# Patient Record
Sex: Male | Born: 1960 | Race: White | Hispanic: No | Marital: Single | Smoking: Light tobacco smoker
Health system: Southern US, Community
[De-identification: ages and names within clinical notes are randomized; demographics above are authoritative.]

## PROBLEM LIST (undated history)

## (undated) DIAGNOSIS — M1990 Unspecified osteoarthritis, unspecified site: Secondary | ICD-10-CM

## (undated) DIAGNOSIS — Z8489 Family history of other specified conditions: Secondary | ICD-10-CM

## (undated) DIAGNOSIS — Z87442 Personal history of urinary calculi: Secondary | ICD-10-CM

## (undated) HISTORY — PX: LITHOTRIPSY: SUR834

---

## 1999-08-26 ENCOUNTER — Emergency Department (HOSPITAL_COMMUNITY): Admission: EM | Admit: 1999-08-26 | Discharge: 1999-08-26 | Payer: Self-pay

## 2000-09-09 ENCOUNTER — Encounter: Payer: Self-pay | Admitting: Emergency Medicine

## 2000-09-09 ENCOUNTER — Emergency Department (HOSPITAL_COMMUNITY): Admission: EM | Admit: 2000-09-09 | Discharge: 2000-09-09 | Payer: Self-pay | Admitting: Emergency Medicine

## 2005-02-14 ENCOUNTER — Emergency Department (HOSPITAL_COMMUNITY): Admission: EM | Admit: 2005-02-14 | Discharge: 2005-02-14 | Payer: Self-pay | Admitting: Emergency Medicine

## 2005-03-24 ENCOUNTER — Emergency Department (HOSPITAL_COMMUNITY): Admission: EM | Admit: 2005-03-24 | Discharge: 2005-03-24 | Payer: Self-pay | Admitting: Emergency Medicine

## 2006-11-23 HISTORY — PX: HAND SURGERY: SHX662

## 2006-12-17 ENCOUNTER — Emergency Department (HOSPITAL_COMMUNITY): Admission: EM | Admit: 2006-12-17 | Discharge: 2006-12-18 | Payer: Self-pay | Admitting: Emergency Medicine

## 2012-05-20 ENCOUNTER — Encounter (HOSPITAL_COMMUNITY): Payer: Self-pay | Admitting: Emergency Medicine

## 2012-05-20 ENCOUNTER — Emergency Department (HOSPITAL_COMMUNITY)
Admission: EM | Admit: 2012-05-20 | Discharge: 2012-05-21 | Disposition: A | Discharge status: Home / Self Care | Payer: Self-pay | Attending: Emergency Medicine | Admitting: Emergency Medicine

## 2012-05-20 DIAGNOSIS — F17200 Nicotine dependence, unspecified, uncomplicated: Secondary | ICD-10-CM | POA: Insufficient documentation

## 2012-05-20 DIAGNOSIS — K529 Noninfective gastroenteritis and colitis, unspecified: Secondary | ICD-10-CM

## 2012-05-20 DIAGNOSIS — K5289 Other specified noninfective gastroenteritis and colitis: Secondary | ICD-10-CM | POA: Insufficient documentation

## 2012-05-20 LAB — POCT I-STAT, CHEM 8
BUN: 13 mg/dL (ref 6–23)
Calcium, Ion: 1.11 mmol/L — ABNORMAL LOW (ref 1.12–1.32)
Chloride: 101 mEq/L (ref 96–112)

## 2012-05-20 MED ORDER — ONDANSETRON HCL 4 MG/2ML IJ SOLN
4.0000 mg | Freq: Once | INTRAMUSCULAR | Status: AC
  Administered 2012-05-20: 4 mg via INTRAVENOUS
  Filled 2012-05-20: qty 2

## 2012-05-20 MED ORDER — SODIUM CHLORIDE 0.9 % IV BOLUS (SEPSIS)
1000.0000 mL | Freq: Once | INTRAVENOUS | Status: AC
  Administered 2012-05-20: 1000 mL via INTRAVENOUS

## 2012-05-20 NOTE — ED Notes (Signed)
PT. REPORTS MID ABDOMINAL PAIN WITH VOMITTING AND DIARRHEA FOR SEVERAL DAYS , STATES HISTORY OF KIDNEY STONES WITH SIMILAR SYMPTOMS. DENIES HEMATURIA OR DYSURIA.

## 2012-05-20 NOTE — ED Provider Notes (Addendum)
History     CSN: [id number]  Arrival date & time 05/20/12  1908   First MD Initiated Contact with Patient 05/20/12 2303      Chief Complaint  Patient presents with  . Abdominal Pain    (Consider location/radiation/quality/duration/timing/severity/associated sxs/prior treatment) The history is provided by the patient.   the patient is a 51 year old, male, with no significant past medical history, who complains of nausea, vomiting, diarrhea, and mild abdominal pain throughout the day.  He denies fevers.  He denies rash.  He denies blood in his stool or emesis.  He, says the emesis is green.  He has not been on antibiotics recently and he denies exposure to anyone with similar symptoms.  Presently, he has no nausea, and no abdominal pain.  He states that he gets lightheaded, when he stands, up.  Past Medical History  Diagnosis Date  . Kidney stones     Past Surgical History  Procedure Date  . Lithotripsy     No family history on file.  History  Substance Use Topics  . Smoking status: Current Everyday Smoker  . Smokeless tobacco: Not on file  . Alcohol Use: Yes      Review of Systems  Constitutional: Positive for chills. Negative for fever and diaphoresis.  Gastrointestinal: Positive for nausea, vomiting, abdominal pain and diarrhea. Negative for blood in stool.  Skin: Negative for rash.  Neurological: Positive for light-headedness.  Psychiatric/Behavioral: Negative for confusion.  All other systems reviewed and are negative.    Allergies  Review of patient's allergies indicates no known allergies.  Home Medications  No current outpatient prescriptions on file.  BP 117/67  Pulse 59  Temp 98.4 F (36.9 C) (Oral)  Resp 18  SpO2 95%  Physical Exam  Nursing note and vitals reviewed. Constitutional: He is oriented to person, place, and time. He appears well-developed and well-nourished. No distress.  HENT:  Head: Normocephalic and atraumatic.  Eyes:  Conjunctivae are normal.  Neck: Normal range of motion. Neck supple.  Cardiovascular: Normal rate.   No murmur heard. Pulmonary/Chest: Effort normal. No respiratory distress.  Abdominal: Soft. He exhibits no distension. There is no tenderness.  Musculoskeletal: Normal range of motion.  Neurological: He is alert and oriented to person, place, and time.  Skin: Skin is warm and dry.  Psychiatric: He has a normal mood and affect. Thought content normal.    ED Course  Procedures (including critical care time) 51 year old, male, with symptoms consistent with gastroenteritis.  He is not toxic.  There is no evidence of significant dehydration.  On examination.  Labs Reviewed - No data to display No results found.   No diagnosis found.  1:06 AM sxs resolved  MDM  Gastroenteritis without signs of toxicity        Cheri Guppy, MD 05/20/12 2325  Cheri Guppy, MD 05/21/12 4098

## 2012-05-21 MED ORDER — LOPERAMIDE HCL 2 MG PO CAPS: 2.0000 mg | ORAL_CAPSULE | Freq: Four times a day (QID) | ORAL | Status: AC | PRN

## 2012-05-21 MED ORDER — METOCLOPRAMIDE HCL 10 MG PO TABS: 10.0000 mg | ORAL_TABLET | Freq: Four times a day (QID) | ORAL | Status: DC

## 2012-05-21 NOTE — Discharge Instructions (Signed)
Your blood tests do not show any signs of significant dehydration from the vomiting, and diarrhea.  Use Imodium for diarrhea, and Reglan for nausea, and vomiting.  Followup with your Dr. if your symptoms.  Last more than 2-3 days

## 2014-05-27 ENCOUNTER — Emergency Department (HOSPITAL_COMMUNITY)
Admission: EM | Admit: 2014-05-27 | Discharge: 2014-05-27 | Disposition: A | Discharge status: Home / Self Care | Payer: Self-pay | Attending: Emergency Medicine | Admitting: Emergency Medicine

## 2014-05-27 ENCOUNTER — Encounter (HOSPITAL_COMMUNITY): Payer: Self-pay | Admitting: Emergency Medicine

## 2014-05-27 ENCOUNTER — Emergency Department (INDEPENDENT_AMBULATORY_CARE_PROVIDER_SITE_OTHER)
Admission: EM | Admit: 2014-05-27 | Discharge: 2014-05-27 | Disposition: A | Discharge status: Other Acute Inpatient Hospital | Payer: Self-pay | Source: Home / Self Care | Attending: Family Medicine | Admitting: Family Medicine

## 2014-05-27 DIAGNOSIS — Z23 Encounter for immunization: Secondary | ICD-10-CM | POA: Insufficient documentation

## 2014-05-27 DIAGNOSIS — W57XXXA Bitten or stung by nonvenomous insect and other nonvenomous arthropods, initial encounter: Secondary | ICD-10-CM

## 2014-05-27 DIAGNOSIS — Z79899 Other long term (current) drug therapy: Secondary | ICD-10-CM | POA: Insufficient documentation

## 2014-05-27 DIAGNOSIS — L03113 Cellulitis of right upper limb: Secondary | ICD-10-CM

## 2014-05-27 DIAGNOSIS — F17200 Nicotine dependence, unspecified, uncomplicated: Secondary | ICD-10-CM | POA: Insufficient documentation

## 2014-05-27 DIAGNOSIS — T148 Other injury of unspecified body region: Principal | ICD-10-CM

## 2014-05-27 DIAGNOSIS — IMO0002 Reserved for concepts with insufficient information to code with codable children: Secondary | ICD-10-CM | POA: Insufficient documentation

## 2014-05-27 DIAGNOSIS — R110 Nausea: Secondary | ICD-10-CM | POA: Insufficient documentation

## 2014-05-27 DIAGNOSIS — Y92009 Unspecified place in unspecified non-institutional (private) residence as the place of occurrence of the external cause: Secondary | ICD-10-CM | POA: Insufficient documentation

## 2014-05-27 DIAGNOSIS — L089 Local infection of the skin and subcutaneous tissue, unspecified: Secondary | ICD-10-CM | POA: Insufficient documentation

## 2014-05-27 DIAGNOSIS — Z87442 Personal history of urinary calculi: Secondary | ICD-10-CM | POA: Insufficient documentation

## 2014-05-27 DIAGNOSIS — Y9389 Activity, other specified: Secondary | ICD-10-CM | POA: Insufficient documentation

## 2014-05-27 LAB — CBC WITH DIFFERENTIAL/PLATELET
Basophils Absolute: 0 10*3/uL (ref 0.0–0.1)
Basophils Relative: 0 % (ref 0–1)
EOS ABS: 0.2 10*3/uL (ref 0.0–0.7)
EOS PCT: 2 % (ref 0–5)
HEMATOCRIT: 50.9 % (ref 39.0–52.0)
Hemoglobin: 17.4 g/dL — ABNORMAL HIGH (ref 13.0–17.0)
LYMPHS ABS: 1.8 10*3/uL (ref 0.7–4.0)
LYMPHS PCT: 12 % (ref 12–46)
MCH: 33.5 pg (ref 26.0–34.0)
MCHC: 34.2 g/dL (ref 30.0–36.0)
MCV: 97.9 fL (ref 78.0–100.0)
MONO ABS: 1.2 10*3/uL — AB (ref 0.1–1.0)
MONOS PCT: 8 % (ref 3–12)
Neutro Abs: 12 10*3/uL — ABNORMAL HIGH (ref 1.7–7.7)
Neutrophils Relative %: 78 % — ABNORMAL HIGH (ref 43–77)
PLATELETS: 173 10*3/uL (ref 150–400)
RBC: 5.2 MIL/uL (ref 4.22–5.81)
RDW: 12.7 % (ref 11.5–15.5)
WBC: 15.2 10*3/uL — AB (ref 4.0–10.5)

## 2014-05-27 LAB — BASIC METABOLIC PANEL
Anion gap: 12 (ref 5–15)
BUN: 8 mg/dL (ref 6–23)
CALCIUM: 9.6 mg/dL (ref 8.4–10.5)
CO2: 28 meq/L (ref 19–32)
CREATININE: 0.71 mg/dL (ref 0.50–1.35)
Chloride: 99 mEq/L (ref 96–112)
GFR calc Af Amer: >90 mL/min (ref >90)
GLUCOSE: 108 mg/dL — AB (ref 70–99)
Potassium: 4.4 mEq/L (ref 3.7–5.3)
Sodium: 139 mEq/L (ref 137–147)

## 2014-05-27 MED ORDER — TETANUS-DIPHTH-ACELL PERTUSSIS 5-2.5-18.5 LF-MCG/0.5 IM SUSP
INTRAMUSCULAR | Status: AC
  Filled 2014-05-27: qty 0.5

## 2014-05-27 MED ORDER — CLINDAMYCIN HCL 150 MG PO CAPS: 150.0000 mg | ORAL_CAPSULE | Freq: Four times a day (QID) | ORAL | Status: DC

## 2014-05-27 MED ORDER — TETANUS-DIPHTH-ACELL PERTUSSIS 5-2.5-18.5 LF-MCG/0.5 IM SUSP
0.5000 mL | Freq: Once | INTRAMUSCULAR | Status: AC
  Administered 2014-05-27: 0.5 mL via INTRAMUSCULAR

## 2014-05-27 MED ORDER — ONDANSETRON 4 MG PO TBDP
4.0000 mg | ORAL_TABLET | Freq: Once | ORAL | Status: AC
  Administered 2014-05-27: 4 mg via ORAL
  Filled 2014-05-27: qty 1

## 2014-05-27 MED ORDER — OXYCODONE-ACETAMINOPHEN 5-325 MG PO TABS
2.0000 | ORAL_TABLET | Freq: Once | ORAL | Status: AC
  Administered 2014-05-27: 2 via ORAL
  Filled 2014-05-27: qty 2

## 2014-05-27 MED ORDER — CLINDAMYCIN PHOSPHATE 600 MG/50ML IV SOLN
600.0000 mg | Freq: Once | INTRAVENOUS | Status: AC
  Administered 2014-05-27: 600 mg via INTRAVENOUS
  Filled 2014-05-27: qty 50

## 2014-05-27 MED ORDER — OXYCODONE-ACETAMINOPHEN 5-325 MG PO TABS: 2.0000 | ORAL_TABLET | ORAL | Status: DC | PRN

## 2014-05-27 NOTE — Discharge Instructions (Signed)
Please keep forearm elevated above your heart as often as possible.  Take antibiotic as prescribed for the full duration, take pain medication as needed.  Follow up with your doctor in 2 days for recheck of your wound.  Return if your symptoms worsen or if you have other concerns.    Cellulitis Cellulitis is an infection of the skin and the tissue beneath it. The infected area is usually red and tender. Cellulitis occurs most often in the arms and lower legs.  CAUSES  Cellulitis is caused by bacteria that enter the skin through cracks or cuts in the skin. The most common types of bacteria that cause cellulitis are Staphylococcus and Streptococcus. SYMPTOMS   Redness and warmth.  Swelling.  Tenderness or pain.  Fever. DIAGNOSIS  Your caregiver can usually determine what is wrong based on a physical exam. Blood tests may also be done. TREATMENT  Treatment usually involves taking an antibiotic medicine. HOME CARE INSTRUCTIONS   Take your antibiotics as directed. Finish them even if you start to feel better.  Keep the infected arm or leg elevated to reduce swelling.  Apply a warm cloth to the affected area up to 4 times per day to relieve pain.  Only take over-the-counter or prescription medicines for pain, discomfort, or fever as directed by your caregiver.  Keep all follow-up appointments as directed by your caregiver. SEEK MEDICAL CARE IF:   You notice red streaks coming from the infected area.  Your red area gets larger or turns dark in color.  Your bone or joint underneath the infected area becomes painful after the skin has healed.  Your infection returns in the same area or another area.  You notice a swollen bump in the infected area.  You develop new symptoms. SEEK IMMEDIATE MEDICAL CARE IF:   You have a fever.  You feel very sleepy.  You develop vomiting or diarrhea.  You have a general ill feeling (malaise) with muscle aches and pains. MAKE SURE YOU:    Understand these instructions.  Will watch your condition.  Will get help right away if you are not doing well or get worse. Document Released: 08/19/2005 Document Revised: 05/10/2012 Document Reviewed: 01/25/2012 [facility] Patient Information 2015 [place], [place]. This information is not intended to replace advice given to you by your health care provider. Make sure you discuss any questions you have with your health care provider.

## 2014-05-27 NOTE — ED Provider Notes (Signed)
Medical screening examination/treatment/procedure(s) were performed by non-physician practitioner and as supervising physician I was immediately available for consultation/collaboration.   EKG Interpretation None        Maudry Diego, MD 05/27/14 [phone]

## 2014-05-27 NOTE — ED Notes (Signed)
Marked and dated right arm swelling: from elbow to top of hand.

## 2014-05-27 NOTE — ED Notes (Signed)
Pt  Has  2  Lesions  On r  Forearm  He noticed  Yesterday  The  Arm is  Swollen  And  Tender  To  The touch   It  Is  Draining  Somewhat     Warm  To  The  Touch                 Pt  States     He  Was  Cleaning  Swimming  Pool  Yesterday     When     He felt  What  May  Have      Been  Bitten  By  something

## 2014-05-27 NOTE — ED Provider Notes (Signed)
CSN: [id number]     Arrival date & time 05/27/14  1012 History   This chart was scribed for non-physician practitioner  Hyman Bible, PA-C, working with Blanchie Dessert, MD, by Neta Ehlers, ED Scribe. This patient was seen in room TR09C/TR09C and the patient's care was started at 11:16 AM.  First MD Initiated Contact with Patient 05/27/14 1102     Chief Complaint  Patient presents with  . Insect Bite    The history is provided by the patient. No language interpreter was used.   HPI Comments: Nathan [name] is a 53 y.o. male who presents to the Emergency Department complaining of a suspected insect bite to his right forearm which occurred yesterday and which has increased in swelling, redness, and pain. He sought treatment at an urgent care today and was referred to the ED for IV antibiotics. The pt reports pus draining from the affected site. He also endorses nausea. He denies a fever; in the ED his temperature is 98.1 F. He also denies h/o DM or HIV. The pt is a current smoker.   Past Medical History  Diagnosis Date  . Kidney stones    Past Surgical History  Procedure Laterality Date  . Lithotripsy     No family history on file. History  Substance Use Topics  . Smoking status: Current Every Day Smoker  . Smokeless tobacco: Not on file  . Alcohol Use: Yes    Review of Systems  Constitutional: Negative for fever.  Gastrointestinal: Positive for nausea. Negative for vomiting.  Skin: Positive for color change and wound.  All other systems reviewed and are negative.   Allergies  Review of patient's allergies indicates no known allergies.  Home Medications   Prior to Admission medications   Medication Sig Start Date End Date Taking? Authorizing Provider  metoCLOPramide (REGLAN) 10 MG tablet Take 1 tablet (10 mg total) by mouth every 6 (six) hours. 05/21/12 05/31/12  Barbara Cower, MD   Triage Vitals: BP 138/75  Pulse 62  Temp(Src) 98.1 F (36.7 C) (Oral)  Resp 22   SpO2 98%  Physical Exam  Nursing note and vitals reviewed. Constitutional: He is oriented to person, place, and time. He appears well-developed and well-nourished. No distress.  HENT:  Head: Normocephalic and atraumatic.  Eyes: Conjunctivae and EOM are normal.  Neck: Neck supple. No tracheal deviation present.  Cardiovascular: Normal rate, regular rhythm and normal heart sounds.   No murmur heard. 2+ radial pulse.   Pulmonary/Chest: Effort normal and breath sounds normal. No respiratory distress. He has no wheezes. He has no rales.  Musculoskeletal: Normal range of motion.  Full ROM of right elbow and right wrist.   Neurological: He is alert and oriented to person, place, and time.  Distal sensation of all fingers of right hand is intact.   Skin: Skin is warm and dry. There is erythema.  1 cm abscess of the dorsal ulnar aspect of the right forearm. Another 1 cm abscess of the same area.  Surrounding the abscesses are erythema and swelling. No streaking.     Psychiatric: He has a normal mood and affect. His behavior is normal.    ED Course  Procedures (including critical care time)  DIAGNOSTIC STUDIES: Oxygen Saturation is 98% on room air, normal by my interpretation.    COORDINATION OF CARE:  11:22 AM- Discussed treatment plan with patient, and the patient agreed to the plan. The plan includes IV antibiotics, pain medication, and possible incision and drainage.  Labs Review Labs Reviewed - No data to display  Imaging Review No results found.   EKG Interpretation None      MDM   Final diagnoses:  None   Patient presenting with Cellulitis of his forearm.  He is currently afebrile.  He has not been on any antibiotics.  Seen at [facility] prior to arrival and sent to the ED for IV antibiotics.  No erythematous streaking.  Patient given IV Clindamycin and will be discharged home with antibiotics.  Patient not immunocompromised.  Patient signed out to Domenic Moras, PA-C.  I  personally performed the services described in this documentation, which was scribed in my presence. The recorded information has been reviewed and is accurate.    Hyman Bible, PA-C 06/06/14 2326

## 2014-05-27 NOTE — ED Notes (Signed)
Pt is calling someone now to make arrangements to be picked up from ED upon discharge.

## 2014-05-27 NOTE — ED Provider Notes (Signed)
Cellulitis of R forearm from insect bite.  Currently receiving IV abx, clindamycin.  Pt without systemic involvement.  Once finished with IV abx, pt can be discharge with PO clinda if sxs controlled.    BP 121/79  Pulse 59  Temp(Src) 97.9 F (36.6 C) (Oral)  Resp 17  SpO2 98%  I have reviewed nursing notes and vital signs. I personally reviewed the imaging tests through PACS system  I reviewed available ER/hospitalization records thought the EMR  Results for orders placed during the hospital encounter of 05/27/14  CBC WITH DIFFERENTIAL      Result Value Ref Range   WBC 15.2 (*) 4.0 - 10.5 K/uL   RBC 5.20  4.22 - 5.81 MIL/uL   Hemoglobin 17.4 (*) 13.0 - 17.0 g/dL   HCT 50.9  39.0 - 52.0 %   MCV 97.9  78.0 - 100.0 fL   MCH 33.5  26.0 - 34.0 pg   MCHC 34.2  30.0 - 36.0 g/dL   RDW 12.7  11.5 - 15.5 %   Platelets 173  150 - 400 K/uL   Neutrophils Relative % 78 (*) 43 - 77 %   Neutro Abs 12.0 (*) 1.7 - 7.7 K/uL   Lymphocytes Relative 12  12 - 46 %   Lymphs Abs 1.8  0.7 - 4.0 K/uL   Monocytes Relative 8  3 - 12 %   Monocytes Absolute 1.2 (*) 0.1 - 1.0 K/uL   Eosinophils Relative 2  0 - 5 %   Eosinophils Absolute 0.2  0.0 - 0.7 K/uL   Basophils Relative 0  0 - 1 %   Basophils Absolute 0.0  0.0 - 0.1 K/uL  BASIC METABOLIC PANEL      Result Value Ref Range   Sodium 139  137 - 147 mEq/L   Potassium 4.4  3.7 - 5.3 mEq/L   Chloride 99  96 - 112 mEq/L   CO2 28  19 - 32 mEq/L   Glucose, Bld 108 (*) 70 - 99 mg/dL   BUN 8  6 - 23 mg/dL   Creatinine, Ser 0.71  0.50 - 1.35 mg/dL   Calcium 9.6  8.4 - 10.5 mg/dL   GFR calc non Af Amer >90  >90 mL/min   GFR calc Af Amer >90  >90 mL/min   Anion gap 12  5 - 15   No results found.    Domenic Moras, PA-C 05/27/14 1424

## 2014-05-27 NOTE — ED Notes (Signed)
Pt. Stated, I [facility] to the [facility] and they sent me here for IV antibiotics.  I got bit yesterday while cleaning out my little girls pool

## 2014-05-27 NOTE — ED Provider Notes (Signed)
CSN: [id number]     Arrival date & time 05/27/14  0908 History   First MD Initiated Contact with Patient 05/27/14 0915     Chief Complaint  Patient presents with  . Extremity Pain   (Consider location/radiation/quality/duration/timing/severity/associated sxs/prior Treatment) Patient is a 53 y.o. male presenting with extremity pain. The history is provided by the patient.  Extremity Pain This is a new problem. The current episode started 12 to 24 hours ago (cleaning pool and noticed 2 bite sites to arm yest, rapidly progressive pain and sts to elbow during day. continues to swell, and pain). The problem has been rapidly worsening.    Past Medical History  Diagnosis Date  . Kidney stones    Past Surgical History  Procedure Laterality Date  . Lithotripsy     History reviewed. No pertinent family history. History  Substance Use Topics  . Smoking status: Current Every Day Smoker  . Smokeless tobacco: Not on file  . Alcohol Use: Yes    Review of Systems  Constitutional: Negative for fever and chills.  Skin: Positive for rash and wound.    Allergies  Review of patient's allergies indicates no known allergies.  Home Medications   Prior to Admission medications   Medication Sig Start Date End Date Taking? Authorizing Provider  metoCLOPramide (REGLAN) 10 MG tablet Take 1 tablet (10 mg total) by mouth every 6 (six) hours. 05/21/12 05/31/12  Barbara Cower, MD   BP 135/85  Pulse 60  Temp(Src) 98.9 F (37.2 C) (Oral)  Resp 16  SpO2 97% Physical Exam  Nursing note and vitals reviewed. Constitutional: He is oriented to person, place, and time. He appears well-developed and well-nourished. He appears distressed.  Neurological: He is alert and oriented to person, place, and time.  Skin: Skin is warm and dry. Rash noted. There is erythema. No pallor.       ED Course  Procedures (including critical care time) Labs Review Labs Reviewed  WOUND CULTURE    Imaging Review No  results found.   MDM   1. Cellulitis of right upper extremity   sent for iv abx for rapidly progressive cellulitis right forarm.    Billy Fischer, MD 05/27/14 [phone]

## 2014-05-29 LAB — WOUND CULTURE
Culture: NO GROWTH
Special Requests: NORMAL

## 2014-06-08 NOTE — ED Provider Notes (Signed)
Medical screening examination/treatment/procedure(s) were performed by non-physician practitioner and as supervising physician I was immediately available for consultation/collaboration.   EKG Interpretation None        Blanchie Dessert, MD 06/08/14 1332

## 2017-08-17 ENCOUNTER — Ambulatory Visit: Payer: Self-pay | Admitting: Orthopedic Surgery

## 2017-08-18 ENCOUNTER — Encounter (HOSPITAL_COMMUNITY)
Admission: RE | Admit: 2017-08-18 | Discharge: 2017-08-18 | Disposition: A | Discharge status: Home / Self Care | Payer: No Typology Code available for payment source | Source: Ambulatory Visit | Attending: Orthopedic Surgery | Admitting: Orthopedic Surgery

## 2017-08-18 ENCOUNTER — Encounter (HOSPITAL_COMMUNITY): Payer: Self-pay

## 2017-08-18 DIAGNOSIS — Z01812 Encounter for preprocedural laboratory examination: Secondary | ICD-10-CM | POA: Diagnosis present

## 2017-08-18 DIAGNOSIS — M1611 Unilateral primary osteoarthritis, right hip: Secondary | ICD-10-CM | POA: Diagnosis not present

## 2017-08-18 HISTORY — DX: Personal history of urinary calculi: Z87.442

## 2017-08-18 HISTORY — DX: Unspecified osteoarthritis, unspecified site: M19.90

## 2017-08-18 LAB — BASIC METABOLIC PANEL
Anion gap: 8 (ref 5–15)
BUN: 7 mg/dL (ref 6–20)
CALCIUM: 9.7 mg/dL (ref 8.9–10.3)
CO2: 26 mmol/L (ref 22–32)
CREATININE: 0.72 mg/dL (ref 0.61–1.24)
Chloride: 101 mmol/L (ref 101–111)
GFR calc non Af Amer: >60 mL/min (ref >60)
Glucose, Bld: 95 mg/dL (ref 65–99)
Potassium: 3.6 mmol/L (ref 3.5–5.1)
SODIUM: 135 mmol/L (ref 135–145)

## 2017-08-18 LAB — CBC
HCT: 47.1 % (ref 39.0–52.0)
Hemoglobin: 16.2 g/dL (ref 13.0–17.0)
MCH: 32.3 pg (ref 26.0–34.0)
MCHC: 34.4 g/dL (ref 30.0–36.0)
MCV: 93.8 fL (ref 78.0–100.0)
PLATELETS: 193 10*3/uL (ref 150–400)
RBC: 5.02 MIL/uL (ref 4.22–5.81)
RDW: 12.2 % (ref 11.5–15.5)
WBC: 11.3 10*3/uL — AB (ref 4.0–10.5)

## 2017-08-18 LAB — SURGICAL PCR SCREEN
MRSA, PCR: NEGATIVE
Staphylococcus aureus: NEGATIVE

## 2017-08-18 LAB — ABO/RH: ABO/RH(D): O POS

## 2017-08-18 LAB — TYPE AND SCREEN
ABO/RH(D): O POS
Antibody Screen: NEGATIVE

## 2017-08-18 NOTE — Progress Notes (Signed)
Pt. rec's PCP from [facility].  Pt. Denies problems related to chest regarding lungs or heart.  Takes no meds., including no pain medicine. Pt. 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

## 2017-08-18 NOTE — Pre-Procedure Instructions (Signed)
Nathan [name]  08/18/2017      [facility] - [place], [place] - [place] [address] Phone: [phone] Fax: [phone]    Your procedure is scheduled on 08/30/2017.  Report to [facility] Admitting at 10:30 A.M.  Call this number if you have problems the morning of surgery:  [phone]   Remember:  Do not eat food or drink liquids after midnight. On SUNDAY  Take these medicines the morning of surgery with A SIP OF WATER : nothing    Do not wear jewelry   Do not wear lotions, powders, or perfumes, or deoderant.    Men may shave face and neck.   Do not bring valuables to the hospital.  [facility] is not responsible for any belongings or valuables.  Contacts, dentures or bridgework may not be worn into surgery.  Leave your suitcase in the car.  After surgery it may be brought to your room.  For patients admitted to the hospital, discharge time will be determined by your treatment team.  Patients discharged the day of surgery will not be allowed to drive home.   Name and phone number of your driver:   Family  Special instructions:  Special Instructions: [facility] - Preparing for Surgery  Before surgery, you can play an important role.  Because skin is not sterile, your skin needs to be as free of germs as possible.  You can reduce the number of germs on you skin by washing with CHG (chlorahexidine gluconate) soap before surgery.  CHG is an antiseptic cleaner which kills germs and bonds with the skin to continue killing germs even after washing.  Please DO NOT use if you have an allergy to CHG or antibacterial soaps.  If your skin becomes reddened/irritated stop using the CHG and inform your nurse when you arrive at Short Stay.  Do not shave (including legs and underarms) for at least 48 hours prior to the first CHG shower.  You may shave your face.  Please follow these instructions  carefully:   1.  Shower with CHG Soap the night before surgery and the  morning of Surgery.  2.  If you choose to wash your hair, wash your hair first as usual with your  normal shampoo.  3.  After you shampoo, rinse your hair and body thoroughly to remove the  Shampoo.  4.  Use CHG as you would any other liquid soap.  You can apply chg directly to the skin and wash gently with scrungie or a clean washcloth.  5.  Apply the CHG Soap to your body ONLY FROM THE NECK DOWN.    Do not use on open wounds or open sores.  Avoid contact with your eyes, ears, mouth and genitals (private parts).  Wash genitals (private parts)   with your normal soap.  6.  Wash thoroughly, paying special attention to the area where your surgery will be performed.  7.  Thoroughly rinse your body with warm water from the neck down.  8.  DO NOT shower/wash with your normal soap after using and rinsing off   the CHG Soap.  9.  Pat yourself dry with a clean towel.            10.  Wear clean pajamas.            11 .  Place clean sheets on your bed the night of your first  shower and do not sleep with pets.  Day of Surgery  Do not apply any lotions/deodorants the morning of surgery.  Please wear clean clothes to the hospital/surgery center.  Please read over the following fact sheets that you were given. Pain Booklet, Coughing and Deep Breathing, Total Joint Packet, MRSA Information and Surgical Site Infection Prevention

## 2017-08-27 ENCOUNTER — Ambulatory Visit: Payer: Self-pay | Admitting: Orthopedic Surgery

## 2017-08-27 MED ORDER — TRANEXAMIC ACID 1000 MG/10ML IV SOLN
1000.0000 mg | INTRAVENOUS | Status: AC
  Administered 2017-08-30: 1000 mg via INTRAVENOUS
  Filled 2017-08-27: qty 10

## 2017-08-27 MED ORDER — SODIUM CHLORIDE 0.9 % IV SOLN: INTRAVENOUS | Status: DC

## 2017-08-27 MED ORDER — CEFAZOLIN SODIUM-DEXTROSE 2-4 GM/100ML-% IV SOLN
2.0000 g | INTRAVENOUS | Status: AC
  Administered 2017-08-30: 2 g via INTRAVENOUS
  Filled 2017-08-27: qty 100

## 2017-08-27 MED ORDER — ACETAMINOPHEN 10 MG/ML IV SOLN
1000.0000 mg | INTRAVENOUS | Status: AC
  Administered 2017-08-30: 1000 mg via INTRAVENOUS
  Filled 2017-08-27 (×2): qty 100

## 2017-08-27 NOTE — H&P (Signed)
TOTAL HIP ADMISSION H&P  Patient is admitted for right total hip arthroplasty.  Subjective:  Chief Complaint: right hip pain  HPI: Nathan [name], 56 y.o. male, has a history of pain and functional disability in the right hip(s) due to arthritis and patient has failed non-surgical conservative treatments for greater than 12 weeks to include NSAID's and/or analgesics, flexibility and strengthening excercises, use of assistive devices, weight reduction as appropriate and activity modification.  Onset of symptoms was gradual starting 5 years ago with gradually worsening course since that time.The patient noted no past surgery on the right hip(s).  Patient currently rates pain in the right hip at 10 out of 10 with activity. Patient has night pain, worsening of pain with activity and weight bearing, trendelenberg gait, pain that interfers with activities of daily living, pain with passive range of motion and crepitus. Patient has evidence of subchondral cysts, subchondral sclerosis, periarticular osteophytes and joint space narrowing by imaging studies. This condition presents safety issues increasing the risk of falls.  There is no current active infection.  There are no active problems to display for this patient.  Past Medical History:  Diagnosis Date  . Arthritis    dengenerative joint surgery-  R hip & L hip  . History of kidney stones    laser surgery  . Kidney stones     Past Surgical History:  Procedure Laterality Date  . HAND SURGERY Right 2008   thumb with pin  . LITHOTRIPSY       (Not in a hospital admission) No Known Allergies  Social History  Substance Use Topics  . Smoking status: Former Smoker    Quit date: 07/28/2017  . Smokeless tobacco: Never Used  . Alcohol use 3.6 oz/week    6 Cans of beer per week     Comment: one six pack per day     No family history on file.   Review of Systems  Constitutional: Negative.   HENT: Negative.   Eyes: Negative.   Respiratory:  Negative.   Cardiovascular: Negative.   Gastrointestinal: Negative.   Genitourinary: Negative.   Musculoskeletal: Positive for joint pain.  Skin: Negative.   Neurological: Negative.   Endo/Heme/Allergies: Negative.   Psychiatric/Behavioral: Negative.     Objective:  Physical Exam  Vitals reviewed. Constitutional: He is oriented to person, place, and time. He appears well-developed and well-nourished.  HENT:  Head: Normocephalic.  Eyes: Pupils are equal, round, and reactive to light. Conjunctivae are normal.  Neck: Normal range of motion. Neck supple.  Cardiovascular: Normal rate, regular rhythm and intact distal pulses.   Respiratory: He is in respiratory distress.  GI: Soft. He exhibits no distension.  Genitourinary:  Genitourinary Comments: deferred  Musculoskeletal:       Right hip: He exhibits decreased range of motion, decreased strength and bony tenderness.  Neurological: He is alert and oriented to person, place, and time. He has normal reflexes.  Skin: Skin is warm and dry.  Psychiatric: He has a normal mood and affect. His behavior is normal. Judgment and thought content normal.    Vital signs in last 24 hours: @VSRANGES @  Labs:   Estimated body mass index is 21.95 kg/m as calculated from the following:   Height as of 08/18/17: 5\' 10"  (1.778 m).   Weight as of 08/18/17: 69.4 kg (153 lb).   Imaging Review Plain radiographs demonstrate severe degenerative joint disease of the right hip(s). The bone quality appears to be adequate for age and reported activity  level.  Assessment/Plan:  End stage arthritis, right hip(s)  The patient history, physical examination, clinical judgement of the provider and imaging studies are consistent with end stage degenerative joint disease of the right hip(s) and total hip arthroplasty is deemed medically necessary. The treatment options including medical management, injection therapy, arthroscopy and arthroplasty were discussed  at length. The risks and benefits of total hip arthroplasty were presented and reviewed. The risks due to aseptic loosening, infection, stiffness, dislocation/subluxation,  thromboembolic complications and other imponderables were discussed.  The patient acknowledged the explanation, agreed to proceed with the plan and consent was signed. Patient is being admitted for inpatient treatment for surgery, pain control, PT, OT, prophylactic antibiotics, VTE prophylaxis, progressive ambulation and ADL's and discharge planning.The patient is planning to be discharged home with home health services

## 2017-08-30 ENCOUNTER — Inpatient Hospital Stay (HOSPITAL_COMMUNITY): Payer: Non-veteran care | Admitting: Anesthesiology

## 2017-08-30 ENCOUNTER — Inpatient Hospital Stay (HOSPITAL_COMMUNITY): Payer: Non-veteran care

## 2017-08-30 ENCOUNTER — Inpatient Hospital Stay (HOSPITAL_COMMUNITY)
Admission: RE | Admit: 2017-08-30 | Discharge: 2017-08-31 | DRG: 470 | Disposition: A | Discharge status: Home / Self Care | Payer: Non-veteran care | Source: Ambulatory Visit | Attending: Orthopedic Surgery | Admitting: Orthopedic Surgery

## 2017-08-30 ENCOUNTER — Encounter (HOSPITAL_COMMUNITY): Payer: Self-pay | Admitting: *Deleted

## 2017-08-30 ENCOUNTER — Encounter (HOSPITAL_COMMUNITY)
Admission: RE | Disposition: A | Discharge status: Home / Self Care | Payer: Self-pay | Source: Ambulatory Visit | Attending: Orthopedic Surgery

## 2017-08-30 DIAGNOSIS — R001 Bradycardia, unspecified: Secondary | ICD-10-CM

## 2017-08-30 DIAGNOSIS — M25751 Osteophyte, right hip: Secondary | ICD-10-CM | POA: Diagnosis present

## 2017-08-30 DIAGNOSIS — Z09 Encounter for follow-up examination after completed treatment for conditions other than malignant neoplasm: Secondary | ICD-10-CM

## 2017-08-30 DIAGNOSIS — M1611 Unilateral primary osteoarthritis, right hip: Principal | ICD-10-CM | POA: Diagnosis present

## 2017-08-30 DIAGNOSIS — Z87442 Personal history of urinary calculi: Secondary | ICD-10-CM | POA: Diagnosis not present

## 2017-08-30 DIAGNOSIS — Z87891 Personal history of nicotine dependence: Secondary | ICD-10-CM | POA: Diagnosis not present

## 2017-08-30 DIAGNOSIS — Z419 Encounter for procedure for purposes other than remedying health state, unspecified: Secondary | ICD-10-CM

## 2017-08-30 HISTORY — PX: TOTAL HIP ARTHROPLASTY: SHX124

## 2017-08-30 LAB — GLUCOSE, CAPILLARY
GLUCOSE-CAPILLARY: 92 mg/dL (ref 65–99)
Glucose-Capillary: 97 mg/dL (ref 65–99)

## 2017-08-30 SURGERY — ARTHROPLASTY, HIP, TOTAL, ANTERIOR APPROACH
Anesthesia: Spinal | Laterality: Right

## 2017-08-30 MED ORDER — PROPOFOL 10 MG/ML IV BOLUS
INTRAVENOUS | Status: AC
Start: 2017-08-30 — End: ?
  Filled 2017-08-30: qty 20

## 2017-08-30 MED ORDER — SODIUM CHLORIDE 0.9 % IR SOLN
Status: DC | PRN
  Administered 2017-08-30: 1000 mL

## 2017-08-30 MED ORDER — FOLIC ACID 1 MG PO TABS
1.0000 mg | ORAL_TABLET | Freq: Every day | ORAL | Status: DC
  Administered 2017-08-30 – 2017-08-31 (×2): 1 mg via ORAL
  Filled 2017-08-30 (×2): qty 1

## 2017-08-30 MED ORDER — FENTANYL CITRATE (PF) 250 MCG/5ML IJ SOLN
INTRAMUSCULAR | Status: DC | PRN
  Administered 2017-08-30: 50 ug via INTRAVENOUS
  Administered 2017-08-30: 25 ug via INTRAVENOUS

## 2017-08-30 MED ORDER — BUPIVACAINE-EPINEPHRINE (PF) 0.5% -1:200000 IJ SOLN
INTRAMUSCULAR | Status: AC
  Filled 2017-08-30: qty 30

## 2017-08-30 MED ORDER — ONDANSETRON HCL 4 MG/2ML IJ SOLN
INTRAMUSCULAR | Status: DC | PRN
  Administered 2017-08-30: 4 mg via INTRAVENOUS

## 2017-08-30 MED ORDER — MENTHOL 3 MG MT LOZG: 1.0000 | LOZENGE | OROMUCOSAL | Status: DC | PRN

## 2017-08-30 MED ORDER — ROCURONIUM BROMIDE 10 MG/ML (PF) SYRINGE
PREFILLED_SYRINGE | INTRAVENOUS | Status: AC
  Filled 2017-08-30: qty 5

## 2017-08-30 MED ORDER — ASPIRIN 81 MG PO CHEW
81.0000 mg | CHEWABLE_TABLET | Freq: Two times a day (BID) | ORAL | Status: DC
  Administered 2017-08-30 – 2017-08-31 (×2): 81 mg via ORAL
  Filled 2017-08-30 (×2): qty 1

## 2017-08-30 MED ORDER — FENTANYL CITRATE (PF) 250 MCG/5ML IJ SOLN
INTRAMUSCULAR | Status: AC
  Filled 2017-08-30: qty 5

## 2017-08-30 MED ORDER — METHOCARBAMOL 1000 MG/10ML IJ SOLN: 500.0000 mg | Freq: Four times a day (QID) | INTRAVENOUS | Status: DC | PRN

## 2017-08-30 MED ORDER — POLYETHYLENE GLYCOL 3350 17 G PO PACK: 17.0000 g | PACK | Freq: Every day | ORAL | Status: DC | PRN

## 2017-08-30 MED ORDER — GLYCOPYRROLATE 0.2 MG/ML IJ SOLN
INTRAMUSCULAR | Status: DC | PRN
  Administered 2017-08-30: 0.2 mg via INTRAVENOUS

## 2017-08-30 MED ORDER — MIDAZOLAM HCL 2 MG/2ML IJ SOLN
INTRAMUSCULAR | Status: AC
  Filled 2017-08-30: qty 2

## 2017-08-30 MED ORDER — PROPOFOL 500 MG/50ML IV EMUL
INTRAVENOUS | Status: DC | PRN
  Administered 2017-08-30: 50 ug/kg/min via INTRAVENOUS

## 2017-08-30 MED ORDER — ONDANSETRON HCL 4 MG PO TABS: 4.0000 mg | ORAL_TABLET | Freq: Four times a day (QID) | ORAL | Status: DC | PRN

## 2017-08-30 MED ORDER — FENTANYL CITRATE (PF) 100 MCG/2ML IJ SOLN: 25.0000 ug | INTRAMUSCULAR | Status: DC | PRN

## 2017-08-30 MED ORDER — KETOROLAC TROMETHAMINE 30 MG/ML IJ SOLN
INTRAMUSCULAR | Status: AC
  Filled 2017-08-30: qty 1

## 2017-08-30 MED ORDER — MIDAZOLAM HCL 2 MG/2ML IJ SOLN
INTRAMUSCULAR | Status: DC | PRN
  Administered 2017-08-30: 2 mg via INTRAVENOUS

## 2017-08-30 MED ORDER — CHLORHEXIDINE GLUCONATE 4 % EX LIQD: 60.0000 mL | Freq: Once | CUTANEOUS | Status: DC

## 2017-08-30 MED ORDER — LIDOCAINE 2% (20 MG/ML) 5 ML SYRINGE
INTRAMUSCULAR | Status: AC
  Filled 2017-08-30: qty 5

## 2017-08-30 MED ORDER — KETOROLAC TROMETHAMINE 15 MG/ML IJ SOLN
15.0000 mg | Freq: Four times a day (QID) | INTRAMUSCULAR | Status: DC
  Administered 2017-08-30 – 2017-08-31 (×3): 15 mg via INTRAVENOUS
  Filled 2017-08-30 (×3): qty 1

## 2017-08-30 MED ORDER — VITAMIN B-1 100 MG PO TABS
100.0000 mg | ORAL_TABLET | Freq: Every day | ORAL | Status: DC
  Administered 2017-08-30 – 2017-08-31 (×2): 100 mg via ORAL
  Filled 2017-08-30 (×2): qty 1

## 2017-08-30 MED ORDER — DEXAMETHASONE SODIUM PHOSPHATE 10 MG/ML IJ SOLN
10.0000 mg | Freq: Once | INTRAMUSCULAR | Status: AC
  Administered 2017-08-31: 10 mg via INTRAVENOUS
  Filled 2017-08-30: qty 1

## 2017-08-30 MED ORDER — ONDANSETRON HCL 4 MG/2ML IJ SOLN
INTRAMUSCULAR | Status: AC
  Filled 2017-08-30: qty 4

## 2017-08-30 MED ORDER — DOCUSATE SODIUM 100 MG PO CAPS
100.0000 mg | ORAL_CAPSULE | Freq: Two times a day (BID) | ORAL | Status: DC
  Administered 2017-08-30 – 2017-08-31 (×2): 100 mg via ORAL
  Filled 2017-08-30 (×2): qty 1

## 2017-08-30 MED ORDER — CEFAZOLIN SODIUM-DEXTROSE 1-4 GM/50ML-% IV SOLN
1.0000 g | Freq: Four times a day (QID) | INTRAVENOUS | Status: AC
  Administered 2017-08-30 – 2017-08-31 (×2): 1 g via INTRAVENOUS
  Filled 2017-08-30 (×2): qty 50

## 2017-08-30 MED ORDER — ONDANSETRON HCL 4 MG/2ML IJ SOLN: 4.0000 mg | Freq: Four times a day (QID) | INTRAMUSCULAR | Status: DC | PRN

## 2017-08-30 MED ORDER — ACETAMINOPHEN 325 MG PO TABS: 650.0000 mg | ORAL_TABLET | Freq: Four times a day (QID) | ORAL | Status: DC | PRN

## 2017-08-30 MED ORDER — METHOCARBAMOL 500 MG PO TABS
500.0000 mg | ORAL_TABLET | Freq: Four times a day (QID) | ORAL | Status: DC | PRN
  Administered 2017-08-30 – 2017-08-31 (×2): 500 mg via ORAL
  Filled 2017-08-30 (×2): qty 1

## 2017-08-30 MED ORDER — EPHEDRINE SULFATE-NACL 50-0.9 MG/10ML-% IV SOSY
PREFILLED_SYRINGE | INTRAVENOUS | Status: DC | PRN
  Administered 2017-08-30: 5 mg via INTRAVENOUS
  Administered 2017-08-30: 10 mg via INTRAVENOUS

## 2017-08-30 MED ORDER — BUPIVACAINE-EPINEPHRINE (PF) 0.5% -1:200000 IJ SOLN
INTRAMUSCULAR | Status: DC | PRN
  Administered 2017-08-30: 30 mL

## 2017-08-30 MED ORDER — KETOROLAC TROMETHAMINE 30 MG/ML IJ SOLN
INTRAMUSCULAR | Status: DC | PRN
  Administered 2017-08-30: 30 mg via INTRA_ARTICULAR

## 2017-08-30 MED ORDER — THIAMINE HCL 100 MG/ML IJ SOLN: 100.0000 mg | Freq: Every day | INTRAMUSCULAR | Status: DC

## 2017-08-30 MED ORDER — LACTATED RINGERS IV SOLN
INTRAVENOUS | Status: DC
  Administered 2017-08-30: 10:00:00 via INTRAVENOUS

## 2017-08-30 MED ORDER — SODIUM CHLORIDE 0.9 % IJ SOLN
INTRAMUSCULAR | Status: DC | PRN
Start: 2017-08-30 — End: 2017-08-30
  Administered 2017-08-30: 30 mL

## 2017-08-30 MED ORDER — METOCLOPRAMIDE HCL 5 MG PO TABS: 5.0000 mg | ORAL_TABLET | Freq: Three times a day (TID) | ORAL | Status: DC | PRN

## 2017-08-30 MED ORDER — PHENOL 1.4 % MT LIQD: 1.0000 | OROMUCOSAL | Status: DC | PRN

## 2017-08-30 MED ORDER — POVIDONE-IODINE 10 % EX SWAB
2.0000 "application " | Freq: Once | CUTANEOUS | Status: AC
  Administered 2017-08-30: 2 via TOPICAL

## 2017-08-30 MED ORDER — SENNA 8.6 MG PO TABS
2.0000 | ORAL_TABLET | Freq: Every day | ORAL | Status: DC
  Administered 2017-08-30: 17.2 mg via ORAL
  Filled 2017-08-30: qty 2

## 2017-08-30 MED ORDER — SUCCINYLCHOLINE CHLORIDE 200 MG/10ML IV SOSY
PREFILLED_SYRINGE | INTRAVENOUS | Status: AC
  Filled 2017-08-30: qty 10

## 2017-08-30 MED ORDER — DIPHENHYDRAMINE HCL 12.5 MG/5ML PO ELIX: 12.5000 mg | ORAL_SOLUTION | ORAL | Status: DC | PRN

## 2017-08-30 MED ORDER — ACETAMINOPHEN 650 MG RE SUPP: 650.0000 mg | Freq: Four times a day (QID) | RECTAL | Status: DC | PRN

## 2017-08-30 MED ORDER — BUPIVACAINE IN DEXTROSE 0.75-8.25 % IT SOLN
INTRATHECAL | Status: DC | PRN
  Administered 2017-08-30: 15 mg via INTRATHECAL

## 2017-08-30 MED ORDER — HYDROCODONE-ACETAMINOPHEN 5-325 MG PO TABS
1.0000 | ORAL_TABLET | ORAL | Status: DC | PRN
  Administered 2017-08-30 – 2017-08-31 (×2): 2 via ORAL
  Filled 2017-08-30 (×2): qty 2

## 2017-08-30 MED ORDER — LORAZEPAM 1 MG PO TABS: 1.0000 mg | ORAL_TABLET | Freq: Four times a day (QID) | ORAL | Status: DC | PRN

## 2017-08-30 MED ORDER — EPHEDRINE 5 MG/ML INJ
INTRAVENOUS | Status: AC
  Filled 2017-08-30: qty 10

## 2017-08-30 MED ORDER — METOCLOPRAMIDE HCL 5 MG/ML IJ SOLN: 5.0000 mg | Freq: Three times a day (TID) | INTRAMUSCULAR | Status: DC | PRN

## 2017-08-30 MED ORDER — PHENYLEPHRINE 40 MCG/ML (10ML) SYRINGE FOR IV PUSH (FOR BLOOD PRESSURE SUPPORT)
PREFILLED_SYRINGE | INTRAVENOUS | Status: AC
  Filled 2017-08-30: qty 10

## 2017-08-30 MED ORDER — SODIUM CHLORIDE 0.9 % IV SOLN
INTRAVENOUS | Status: DC
  Administered 2017-08-30 – 2017-08-31 (×2): via INTRAVENOUS

## 2017-08-30 MED ORDER — LORAZEPAM 2 MG/ML IJ SOLN: 1.0000 mg | Freq: Four times a day (QID) | INTRAMUSCULAR | Status: DC | PRN

## 2017-08-30 MED ORDER — ADULT MULTIVITAMIN W/MINERALS CH
1.0000 | ORAL_TABLET | Freq: Every day | ORAL | Status: DC
  Administered 2017-08-30 – 2017-08-31 (×2): 1 via ORAL
  Filled 2017-08-30 (×2): qty 1

## 2017-08-30 SURGICAL SUPPLY — 53 items
ADH SKN CLS APL DERMABOND .7 (GAUZE/BANDAGES/DRESSINGS) ×1
ALCOHOL ISOPROPYL (RUBBING) (MISCELLANEOUS) ×3 IMPLANT
BLADE CLIPPER SURG (BLADE) IMPLANT
CAPT HIP TOTAL 2 ×2 IMPLANT
CHLORAPREP W/TINT 26ML (MISCELLANEOUS) ×3 IMPLANT
COVER SURGICAL LIGHT HANDLE (MISCELLANEOUS) ×3 IMPLANT
DERMABOND ADVANCED (GAUZE/BANDAGES/DRESSINGS) ×2
DERMABOND ADVANCED .7 DNX12 (GAUZE/BANDAGES/DRESSINGS) ×2 IMPLANT
DRAPE C-ARM 42X72 X-RAY (DRAPES) ×3 IMPLANT
DRAPE STERI IOBAN 125X83 (DRAPES) ×3 IMPLANT
DRAPE U-SHAPE 47X51 STRL (DRAPES) ×9 IMPLANT
DRSG AQUACEL AG ADV 3.5X10 (GAUZE/BANDAGES/DRESSINGS) ×3 IMPLANT
ELECT BLADE 4.0 EZ CLEAN MEGAD (MISCELLANEOUS) ×3
ELECT REM PT RETURN 9FT ADLT (ELECTROSURGICAL) ×3
ELECTRODE BLDE 4.0 EZ CLN MEGD (MISCELLANEOUS) ×1 IMPLANT
ELECTRODE REM PT RTRN 9FT ADLT (ELECTROSURGICAL) ×1 IMPLANT
EVACUATOR 1/8 PVC DRAIN (DRAIN) IMPLANT
GLOVE BIO SURGEON STRL SZ8.5 (GLOVE) ×6 IMPLANT
GLOVE BIOGEL PI IND STRL 8.5 (GLOVE) ×1 IMPLANT
GLOVE BIOGEL PI INDICATOR 8.5 (GLOVE) ×2
GOWN STRL REUS W/ TWL LRG LVL3 (GOWN DISPOSABLE) ×2 IMPLANT
GOWN STRL REUS W/TWL 2XL LVL3 (GOWN DISPOSABLE) ×3 IMPLANT
GOWN STRL REUS W/TWL LRG LVL3 (GOWN DISPOSABLE) ×6
HANDPIECE INTERPULSE COAX TIP (DISPOSABLE) ×3
HOOD PEEL AWAY FACE SHEILD DIS (HOOD) ×6 IMPLANT
KIT BASIN OR (CUSTOM PROCEDURE TRAY) ×3 IMPLANT
KIT ROOM TURNOVER OR (KITS) ×3 IMPLANT
MANIFOLD NEPTUNE II (INSTRUMENTS) ×3 IMPLANT
MARKER SKIN DUAL TIP RULER LAB (MISCELLANEOUS) ×6 IMPLANT
NDL SPNL 18GX3.5 QUINCKE PK (NEEDLE) ×1 IMPLANT
NEEDLE SPNL 18GX3.5 QUINCKE PK (NEEDLE) ×3 IMPLANT
NS IRRIG 1000ML POUR BTL (IV SOLUTION) ×3 IMPLANT
PACK TOTAL JOINT (CUSTOM PROCEDURE TRAY) ×3 IMPLANT
PACK UNIVERSAL I (CUSTOM PROCEDURE TRAY) ×3 IMPLANT
PAD ARMBOARD 7.5X6 YLW CONV (MISCELLANEOUS) ×6 IMPLANT
SAW OSC TIP CART 19.5X105X1.3 (SAW) ×3 IMPLANT
SEALER BIPOLAR AQUA 6.0 (INSTRUMENTS) ×2 IMPLANT
SET HNDPC FAN SPRY TIP SCT (DISPOSABLE) ×1 IMPLANT
SOL PREP POV-IOD 4OZ 10% (MISCELLANEOUS) ×1 IMPLANT
SUT ETHIBOND NAB CT1 #1 30IN (SUTURE) ×6 IMPLANT
SUT MNCRL AB 3-0 PS2 18 (SUTURE) ×3 IMPLANT
SUT MON AB 2-0 CT1 36 (SUTURE) ×3 IMPLANT
SUT VIC AB 1 CT1 27 (SUTURE) ×3
SUT VIC AB 1 CT1 27XBRD ANBCTR (SUTURE) ×1 IMPLANT
SUT VIC AB 2-0 CT1 27 (SUTURE) ×3
SUT VIC AB 2-0 CT1 TAPERPNT 27 (SUTURE) ×1 IMPLANT
SUT VLOC 180 0 24IN GS25 (SUTURE) ×3 IMPLANT
SYR 50ML LL SCALE MARK (SYRINGE) ×3 IMPLANT
TOWEL OR 17X24 6PK STRL BLUE (TOWEL DISPOSABLE) ×3 IMPLANT
TOWEL OR 17X26 10 PK STRL BLUE (TOWEL DISPOSABLE) ×3 IMPLANT
TRAY CATH 16FR W/PLASTIC CATH (SET/KITS/TRAYS/PACK) IMPLANT
TRAY FOLEY CATH SILVER 16FR (SET/KITS/TRAYS/PACK) IMPLANT
WATER STERILE IRR 1000ML POUR (IV SOLUTION) ×3 IMPLANT

## 2017-08-30 NOTE — Consult Note (Signed)
Cardiology Consultation:   Patient ID: Nathan [name]; [id number]; Apr 02, 1961   Admit date: 08/30/2017 Date of Consult: 08/30/2017  Primary Care Provider: [facility] Primary Cardiologist: new, Dr Oval Linsey Primary Electrophysiologist:  n/a   Patient Profile:   Nathan [name] is a 56 y.o. male with a hx of DJD, kidney stones, tob use, who is being seen today for the evaluation of arrhythmia at the request of Dr Lyla Glassing.  History of Present Illness:   [name] has OA and the disease in his R hip had progressed. He was scheduled for R-THR, and came to the hospital for the procedure.   In preop, he was noted to have bradycardia and possible VT, cards asked to see.   Mr [name], never gets CP/SOB. He used to run and play basketball. Has not done these things since his hip got bad. He still walks 4 blocks twice a day to take the kids to the bus stop. He is limited by his hip, not by his breathing.  He has never noticed his heart rate much. He has rarely felt a premature beat. He has never felt symptoms from this. No hx presyncope or syncope.   Denies orthopnea, PND, LE edema. Has been coughing up some clear phlegm since he quit tobacco. No fevers or chills.   Past Medical History:  Diagnosis Date  . Arthritis    dengenerative joint surgery-  R hip & L hip  . History of kidney stones    laser surgery  . Kidney stones     Past Surgical History:  Procedure Laterality Date  . HAND SURGERY Right 2008   thumb with pin  . LITHOTRIPSY       Inpatient Medications: Scheduled Meds: . chlorhexidine  60 mL Topical Once  . chlorhexidine  60 mL Topical Once   Continuous Infusions: . sodium chloride    . acetaminophen    .  ceFAZolin (ANCEF) IV    . lactated ringers 10 mL/hr at 08/30/17 0943  . tranexamic acid     PRN Meds:  Prior to Admission medications   None    Allergies:   No Known Allergies  Social History:   Social History   Social History  . Marital  status: Single    Spouse name: N/A  . Number of children: N/A  . Years of education: N/A   Occupational History  . Electrician    Social History Main Topics  . Smoking status: Former Smoker    Quit date: 07/28/2017  . Smokeless tobacco: Never Used  . Alcohol use 3.6 oz/week    6 Cans of beer per week     Comment: one six pack per week  . Drug use: Yes    Frequency: 14.0 times per week    Types: Marijuana     Comment: 2 joints per day  . Sexual activity: Not on file   Other Topics Concern  . Not on file   Social History Narrative   Pt lives with fiance and 2 children.    Family History:   The patient's family history includes Anemia (age of onset: 38) in his father; Cancer - Colon (age of onset: 54) in his mother. Pt indicated that his mother is deceased. He indicated that his father is deceased. He indicated that his maternal grandmother is deceased. He indicated that his maternal grandfather is deceased. He indicated that his paternal grandmother is deceased. He indicated that his paternal grandfather is deceased.  ROS:  Please see the history of present illness.  All other ROS reviewed and negative.      Physical Exam/Data:   Vitals:   08/30/17 0909  BP: (!) 149/77  Pulse: (!) 43  Resp: 18  Temp: 97.9 F (36.6 C)  TempSrc: Oral  SpO2: 99%  Weight: 153 lb (69.4 kg)   No intake or output data in the 24 hours ending 08/30/17 1307 Filed Weights   08/30/17 0909  Weight: 153 lb (69.4 kg)   Body mass index is 21.95 kg/m.  General:  Well nourished, well developed, in no acute distress HEENT: normal Lymph: no adenopathy Neck: no JVD Endocrine:  No thryomegaly Vascular: No carotid bruits; FA pulses 2+ bilaterally without bruits  Cardiac:  normal S1, S2; RRR; no murmur  Lungs:  clear to auscultation bilaterally, no wheezing, rhonchi or rales  Abd: soft, nontender, no hepatomegaly  Ext: no edema Musculoskeletal:  No deformities, BUE and BLE strength normal and  equal Skin: warm and dry  Neuro:  CNs 2-12 intact, no focal abnormalities noted Psych:  Normal affect   EKG:  The EKG was personally reviewed and demonstrates:  Sinus brady, HR 39, no acute ischemic changes Telemetry:  Telemetry was personally reviewed and demonstrates:  Sinus brady 40s, PACs  Relevant CV Studies:  None  Laboratory Data:  Chemistry  BMET    Component Value Date/Time   NA 135 08/18/2017 1642   K 3.6 08/18/2017 1642   CL 101 08/18/2017 1642   CO2 26 08/18/2017 1642   GLUCOSE 95 08/18/2017 1642   BUN 7 08/18/2017 1642   CREATININE 0.72 08/18/2017 1642   CALCIUM 9.7 08/18/2017 1642   GFRNONAA >60 08/18/2017 1642   GFRAA >60 08/18/2017 1642    Hematology CBC    Component Value Date/Time   WBC 11.3 (H) 08/18/2017 1642   RBC 5.02 08/18/2017 1642   HGB 16.2 08/18/2017 1642   HCT 47.1 08/18/2017 1642   PLT 193 08/18/2017 1642   MCV 93.8 08/18/2017 1642   MCH 32.3 08/18/2017 1642   MCHC 34.4 08/18/2017 1642   RDW 12.2 08/18/2017 1642    Radiology/Studies:  none   Assessment and Plan:   1. Sinus bradycardia - possible VT reviewed, seems to be artifact - HR slow but he is asymptomatic - no rate-lowering rx - based on ECG review and interview w/ pt, no further cardiac workup needed. - He is at acceptable risk for the surgery.   Otherwise, per [facility] Principal Problem:   Osteoarthritis of right hip     Signed, Rosaria Ferries, PA-C  08/30/2017 1:07 PM

## 2017-08-30 NOTE — Anesthesia Procedure Notes (Signed)
Spinal  Patient location during procedure: OR Start time: 08/30/2017 2:23 PM End time: 08/30/2017 2:33 PM Staffing Anesthesiologist: Duane Boston Performed: anesthesiologist  Preanesthetic Checklist Completed: patient identified, surgical consent, pre-op evaluation, timeout performed, IV checked, risks and benefits discussed and monitors and equipment checked Spinal Block Patient position: sitting Prep: DuraPrep Patient monitoring: cardiac monitor, continuous pulse ox and blood pressure Approach: midline Location: L2-3 Injection technique: single-shot Needle Needle type: Pencan  Needle gauge: 24 G Needle length: 9 cm Additional Notes Functioning IV was confirmed and monitors were applied. Sterile prep and drape, including hand hygiene and sterile gloves were used. The patient was positioned and the spine was prepped. The skin was anesthetized with lidocaine.  Free flow of clear CSF was obtained prior to injecting local anesthetic into the CSF.  The spinal needle aspirated freely following injection.  The needle was carefully withdrawn.  The patient tolerated the procedure well.

## 2017-08-30 NOTE — Discharge Instructions (Signed)
°Dr. Emani Morad °Joint Replacement Specialist °[facility] °[address] °[place], [place] [address] °[phone] ° ° °TOTAL HIP REPLACEMENT POSTOPERATIVE DIRECTIONS ° ° ° °Hip Rehabilitation, Guidelines Following Surgery  ° °WEIGHT BEARING °Weight bearing as tolerated with assist device (walker, cane, etc) as directed, use it as long as suggested by your surgeon or therapist, typically at least 4-6 weeks. ° °The results of a hip operation are greatly improved after range of motion and muscle strengthening exercises. Follow all safety measures which are given to protect your hip. If any of these exercises cause increased pain or swelling in your joint, decrease the amount until you are comfortable again. Then slowly increase the exercises. Call your caregiver if you have problems or questions.  ° °HOME CARE INSTRUCTIONS  °Most of the following instructions are designed to prevent the dislocation of your new hip.  °Remove items at home which could result in a fall. This includes throw rugs or furniture in walking pathways.  °Continue medications as instructed at time of discharge. °· You may have some home medications which will be placed on hold until you complete the course of blood thinner medication. °· You may start showering once you are discharged home. Do not remove your dressing. °Do not put on socks or shoes without following the instructions of your caregivers.   °Sit on chairs with arms. Use the chair arms to help push yourself up when arising.  °Arrange for the use of a toilet seat elevator so you are not sitting low.  °· Walk with walker as instructed.  °You may resume a sexual relationship in one month or when given the OK by your caregiver.  °Use walker as long as suggested by your caregivers.  °You may put full weight on your legs and walk as much as is comfortable. °Avoid periods of inactivity such as sitting longer than an hour when not asleep. This helps prevent  blood clots.  °You may return to work once you are cleared by your surgeon.  °Do not drive a car for 6 weeks or until released by your surgeon.  °Do not drive while taking narcotics.  °Wear elastic stockings for two weeks following surgery during the day but you may remove then at night.  °Make sure you keep all of your appointments after your operation with all of your doctors and caregivers. You should call the office at the above phone number and make an appointment for approximately two weeks after the date of your surgery. °Please pick up a stool softener and laxative for home use as long as you are requiring pain medications. °· ICE to the affected hip every three hours for 30 minutes at a time and then as needed for pain and swelling. Continue to use ice on the hip for pain and swelling from surgery. You may notice swelling that will progress down to the foot and ankle.  This is normal after surgery.  Elevate the leg when you are not up walking on it.   °It is important for you to complete the blood thinner medication as prescribed by your doctor. °· Continue to use the breathing machine which will help keep your temperature down.  It is common for your temperature to cycle up and down following surgery, especially at night when you are not up moving around and exerting yourself.  The breathing machine keeps your lungs expanded and your temperature down. ° °RANGE OF MOTION AND STRENGTHENING EXERCISES  °These exercises are   designed to help you keep full movement of your hip joint. Follow your caregiver's or physical therapist's instructions. Perform all exercises about fifteen times, three times per day or as directed. Exercise both hips, even if you have had only one joint replacement. These exercises can be done on a training (exercise) mat, on the floor, on a table or on a bed. Use whatever works the best and is most comfortable for you. Use music or television while you are exercising so that the exercises  are a pleasant break in your day. This will make your life better with the exercises acting as a break in routine you can look forward to.  °Lying on your back, slowly slide your foot toward your buttocks, raising your knee up off the floor. Then slowly slide your foot back down until your leg is straight again.  °Lying on your back spread your legs as far apart as you can without causing discomfort.  °Lying on your side, raise your upper leg and foot straight up from the floor as far as is comfortable. Slowly lower the leg and repeat.  °Lying on your back, tighten up the muscle in the front of your thigh (quadriceps muscles). You can do this by keeping your leg straight and trying to raise your heel off the floor. This helps strengthen the largest muscle supporting your knee.  °Lying on your back, tighten up the muscles of your buttocks both with the legs straight and with the knee bent at a comfortable angle while keeping your heel on the floor.  ° °SKILLED REHAB INSTRUCTIONS: °If the patient is transferred to a skilled rehab facility following release from the hospital, a list of the current medications will be sent to the facility for the patient to continue.  When discharged from the skilled rehab facility, please have the facility set up the patient's Home Health Physical Therapy prior to being released. Also, the skilled facility will be responsible for providing the patient with their medications at time of release from the facility to include their pain medication and their blood thinner medication. If the patient is still at the rehab facility at time of the two week follow up appointment, the skilled rehab facility will also need to assist the patient in arranging follow up appointment in our office and any transportation needs. ° °MAKE SURE YOU:  °Understand these instructions.  °Will watch your condition.  °Will get help right away if you are not doing well or get worse. ° °Pick up stool softner and  laxative for home use following surgery while on pain medications. °Do not remove your dressing. °The dressing is waterproof--it is OK to take showers. °Continue to use ice for pain and swelling after surgery. °Do not use any lotions or creams on the incision until instructed by your surgeon. °Total Hip Protocol. ° ° °

## 2017-08-30 NOTE — Anesthesia Postprocedure Evaluation (Signed)
Anesthesia Post Note  Patient: Nathan [name]  Procedure(s) Performed: TOTAL HIP ARTHROPLASTY ANTERIOR APPROACH (Right )     Patient location during evaluation: PACU Anesthesia Type: Spinal Level of consciousness: awake and alert Pain management: pain level controlled Vital Signs Assessment: post-procedure vital signs reviewed and stable Respiratory status: spontaneous breathing and respiratory function stable Cardiovascular status: blood pressure returned to baseline and stable Postop Assessment: spinal receding Anesthetic complications: no    Last Vitals:  Vitals:   08/30/17 1703 08/30/17 1720  BP: 131/83 (!) 123/92  Pulse: (!) 43 (!) 52  Resp: 18 14  Temp:    SpO2: 99% 100%    Last Pain:  Vitals:   08/30/17 1700  TempSrc:   PainSc: 0-No pain                 Smitty Ackerley DANIEL

## 2017-08-30 NOTE — Transfer of Care (Signed)
Immediate Anesthesia Transfer of Care Note  Patient: Nathan [name]  Procedure(s) Performed: TOTAL HIP ARTHROPLASTY ANTERIOR APPROACH (Right )  Patient Location: PACU  Anesthesia Type:Spinal and MAC combined with regional for post-op pain  Level of Consciousness: awake, alert , oriented and patient cooperative  Airway & Oxygen Therapy: Patient Spontanous Breathing  Post-op Assessment: Report given to RN and Post -op Vital signs reviewed and stable  Post vital signs: Reviewed and stable  Last Vitals:  Vitals:   08/30/17 0909 08/30/17 1633  BP: (!) 149/77   Pulse: (!) 43   Resp: 18   Temp: 36.6 C (!) (P) 36.4 C  SpO2: 99%     Last Pain:  Vitals:   08/30/17 1633  TempSrc:   PainSc: (P) 0-No pain      Patients Stated Pain Goal: 4 (07/07/47 1856)  Complications: No apparent anesthesia complications

## 2017-08-30 NOTE — Interval H&P Note (Signed)
History and Physical Interval Note:  08/30/2017 9:52 AM  Nathan [name]  has presented today for surgery, with the diagnosis of Right hip degenerative joint disease  The various methods of treatment have been discussed with the patient and family. After consideration of risks, benefits and other options for treatment, the patient has consented to  Procedure(s): TOTAL HIP ARTHROPLASTY ANTERIOR APPROACH (Right) as a surgical intervention .  The patient's history has been reviewed, patient examined, no change in status, stable for surgery.  I have reviewed the patient's chart and labs.  Questions were answered to the patient's satisfaction.     Jaiel Saraceno, Horald Pollen

## 2017-08-30 NOTE — Anesthesia Preprocedure Evaluation (Signed)
Anesthesia Evaluation  Patient identified by MRN, date of birth, ID band Patient awake    Reviewed: Allergy & Precautions, H&P , Patient's Chart, lab work & pertinent test results, reviewed documented beta blocker date and time   Airway Mallampati: II  TM Distance: >3 FB Neck ROM: full    Dental no notable dental hx.    Pulmonary former smoker,    Pulmonary exam normal breath sounds clear to auscultation       Cardiovascular  Rhythm:regular Rate:Normal     Neuro/Psych    GI/Hepatic   Endo/Other    Renal/GU      Musculoskeletal   Abdominal   Peds  Hematology   Anesthesia Other Findings   Reproductive/Obstetrics                             Anesthesia Physical Anesthesia Plan  ASA: II  Anesthesia Plan: Spinal   Post-op Pain Management:    Induction:   PONV Risk Score and Plan: 1 and Ondansetron, Dexamethasone and Treatment may vary due to age or medical condition  Airway Management Planned:   Additional Equipment:   Intra-op Plan:   Post-operative Plan:   Informed Consent: I have reviewed the patients History and Physical, chart, labs and discussed the procedure including the risks, benefits and alternatives for the proposed anesthesia with the patient or authorized representative who has indicated his/her understanding and acceptance.   Dental Advisory Given  Plan Discussed with: CRNA and Surgeon  Anesthesia Plan Comments: (  )        Anesthesia Quick Evaluation

## 2017-08-30 NOTE — Op Note (Signed)
OPERATIVE REPORT  SURGEON: Rod Can, MD   ASSISTANT: Ky Barban, RNFA.  PREOPERATIVE DIAGNOSIS: Right hip arthritis.   POSTOPERATIVE DIAGNOSIS: Right hip arthritis.   PROCEDURE: Right total hip arthroplasty, anterior approach.   IMPLANTS: Biomet Taperloc Complete Microplasty stem, size 17 x 119, standard offset. Biomet G7 Cup, size 56 mm. Biomet E1 liner, size 36 mm, F, neutral. Biomet Biolox ceramic head ball, size 36 -3 mm.  ANESTHESIA:  Spinal  ESTIMATED BLOOD LOSS: 300 mL.  ANTIBIOTICS: 2 g Ancef.  DRAINS: None.  COMPLICATIONS: None.   CONDITION: PACU - hemodynamically stable.   BRIEF CLINICAL NOTE: Nathan [name] is a 56 y.o. male with a long-standing history of Right hip arthritis. After failing conservative management, the patient was indicated for total hip arthroplasty. The risks, benefits, and alternatives to the procedure were explained, and the patient elected to proceed.  PROCEDURE IN DETAIL: Surgical site was marked by myself in the pre-op holding area. Once inside the operating room, spinal anesthesia was obtained, and a foley catheter was inserted. The patient was then positioned on the Hana table. All bony prominences were well padded. The hip was prepped and draped in the normal sterile surgical fashion. A time-out was called verifying side and site of surgery. The patient received IV antibiotics within 60 minutes of beginning the procedure.  The direct anterior approach to the hip was performed through the Hueter interval. Lateral femoral circumflex vessels were treated with the Auqumantys. The anterior capsule was exposed and an inverted T capsulotomy was made.The femoral neck cut was made to the level of the templated cut. A corkscrew was placed into the head and the head was removed. The femoral head was found to have eburnated bone. The head was passed to the back table and was measured.  Acetabular exposure was achieved, and the  pulvinar and labrum were excised. Sequential reaming of the acetabulum was then performed up to a size 55 mm reamer. A 56 mm cup was then opened and impacted into place at approximately 40 degrees of abduction and 20 degrees of anteversion. The final polyethylene liner was impacted into place and acetabular osteophytes were removed.   I then gained femoral exposure taking care to protect the abductors and greater trochanter. This was performed using standard external rotation, extension, and adduction. The capsule was peeled off the inner aspect of the greater trochanter, taking care to preserve the short external rotators. A cookie cutter was used to enter the femoral canal, and then the femoral canal finder was placed. Sequential broaching was performed up to a size 17. Calcar planer was used on the femoral neck remnant. I placed a std offset neck and a trial head ball. The hip was reduced. Leg lengths and offset were checked fluoroscopically. The hip was dislocated and trial components were removed. The final implants were placed, and the hip was reduced.  Fluoroscopy was used to confirm component position and leg lengths. At 90 degrees of external rotation and full extension, the hip was stable to an anterior directed force.  The wound was copiously irrigated with normal saline using pulse lavage. Marcaine solution was injected into the periarticular soft tissue. The wound was closed in layers using #1 Vicryl and V-Loc for the fascia, 2-0 Vicryl for the subcutaneous fat, 2-0 Monocryl for the deep dermal layer, 3-0 running Monocryl subcuticular stitch, and Dermabond for the skin. Once the glue was fully dried, an Aquacell Ag dressing was applied. The patient was transported to the recovery room  in stable condition. Sponge, needle, and instrument counts were correct at the end of the case x2. The patient tolerated the procedure well and there were no known complications.

## 2017-08-30 NOTE — H&P (View-Only) (Signed)
TOTAL HIP ADMISSION H&P  Patient is admitted for right total hip arthroplasty.  Subjective:  Chief Complaint: right hip pain  HPI: Nathan [name], 56 y.o. male, has a history of pain and functional disability in the right hip(s) due to arthritis and patient has failed non-surgical conservative treatments for greater than 12 weeks to include NSAID's and/or analgesics, flexibility and strengthening excercises, use of assistive devices, weight reduction as appropriate and activity modification.  Onset of symptoms was gradual starting 5 years ago with gradually worsening course since that time.The patient noted no past surgery on the right hip(s).  Patient currently rates pain in the right hip at 10 out of 10 with activity. Patient has night pain, worsening of pain with activity and weight bearing, trendelenberg gait, pain that interfers with activities of daily living, pain with passive range of motion and crepitus. Patient has evidence of subchondral cysts, subchondral sclerosis, periarticular osteophytes and joint space narrowing by imaging studies. This condition presents safety issues increasing the risk of falls.  There is no current active infection.  There are no active problems to display for this patient.  Past Medical History:  Diagnosis Date  . Arthritis    dengenerative joint surgery-  R hip & L hip  . History of kidney stones    laser surgery  . Kidney stones     Past Surgical History:  Procedure Laterality Date  . HAND SURGERY Right 2008   thumb with pin  . LITHOTRIPSY       (Not in a hospital admission) No Known Allergies  Social History  Substance Use Topics  . Smoking status: Former Smoker    Quit date: 07/28/2017  . Smokeless tobacco: Never Used  . Alcohol use 3.6 oz/week    6 Cans of beer per week     Comment: one six pack per day     No family history on file.   Review of Systems  Constitutional: Negative.   HENT: Negative.   Eyes: Negative.   Respiratory:  Negative.   Cardiovascular: Negative.   Gastrointestinal: Negative.   Genitourinary: Negative.   Musculoskeletal: Positive for joint pain.  Skin: Negative.   Neurological: Negative.   Endo/Heme/Allergies: Negative.   Psychiatric/Behavioral: Negative.     Objective:  Physical Exam  Vitals reviewed. Constitutional: He is oriented to person, place, and time. He appears well-developed and well-nourished.  HENT:  Head: Normocephalic.  Eyes: Pupils are equal, round, and reactive to light. Conjunctivae are normal.  Neck: Normal range of motion. Neck supple.  Cardiovascular: Normal rate, regular rhythm and intact distal pulses.   Respiratory: He is in respiratory distress.  GI: Soft. He exhibits no distension.  Genitourinary:  Genitourinary Comments: deferred  Musculoskeletal:       Right hip: He exhibits decreased range of motion, decreased strength and bony tenderness.  Neurological: He is alert and oriented to person, place, and time. He has normal reflexes.  Skin: Skin is warm and dry.  Psychiatric: He has a normal mood and affect. His behavior is normal. Judgment and thought content normal.    Vital signs in last 24 hours: @VSRANGES @  Labs:   Estimated body mass index is 21.95 kg/m as calculated from the following:   Height as of 08/18/17: 5\' 10"  (1.778 m).   Weight as of 08/18/17: 69.4 kg (153 lb).   Imaging Review Plain radiographs demonstrate severe degenerative joint disease of the right hip(s). The bone quality appears to be adequate for age and reported activity  level.  Assessment/Plan:  End stage arthritis, right hip(s)  The patient history, physical examination, clinical judgement of the provider and imaging studies are consistent with end stage degenerative joint disease of the right hip(s) and total hip arthroplasty is deemed medically necessary. The treatment options including medical management, injection therapy, arthroscopy and arthroplasty were discussed  at length. The risks and benefits of total hip arthroplasty were presented and reviewed. The risks due to aseptic loosening, infection, stiffness, dislocation/subluxation,  thromboembolic complications and other imponderables were discussed.  The patient acknowledged the explanation, agreed to proceed with the plan and consent was signed. Patient is being admitted for inpatient treatment for surgery, pain control, PT, OT, prophylactic antibiotics, VTE prophylaxis, progressive ambulation and ADL's and discharge planning.The patient is planning to be discharged home with home health services

## 2017-08-31 ENCOUNTER — Encounter (HOSPITAL_COMMUNITY): Payer: Self-pay | Admitting: Orthopedic Surgery

## 2017-08-31 LAB — BASIC METABOLIC PANEL
Anion gap: 7 (ref 5–15)
BUN: 7 mg/dL (ref 6–20)
CALCIUM: 8.5 mg/dL — AB (ref 8.9–10.3)
CHLORIDE: 102 mmol/L (ref 101–111)
CO2: 28 mmol/L (ref 22–32)
CREATININE: 0.78 mg/dL (ref 0.61–1.24)
GFR calc Af Amer: >60 mL/min (ref >60)
GFR calc non Af Amer: >60 mL/min (ref >60)
GLUCOSE: 116 mg/dL — AB (ref 65–99)
Potassium: 3.7 mmol/L (ref 3.5–5.1)
Sodium: 137 mmol/L (ref 135–145)

## 2017-08-31 LAB — CBC
HEMATOCRIT: 37.3 % — AB (ref 39.0–52.0)
HEMOGLOBIN: 12.3 g/dL — AB (ref 13.0–17.0)
MCH: 31.3 pg (ref 26.0–34.0)
MCHC: 33 g/dL (ref 30.0–36.0)
MCV: 94.9 fL (ref 78.0–100.0)
Platelets: 156 10*3/uL (ref 150–400)
RBC: 3.93 MIL/uL — ABNORMAL LOW (ref 4.22–5.81)
RDW: 12.3 % (ref 11.5–15.5)
WBC: 14.8 10*3/uL — ABNORMAL HIGH (ref 4.0–10.5)

## 2017-08-31 MED ORDER — ONDANSETRON HCL 4 MG PO TABS: 4.0000 mg | ORAL_TABLET | Freq: Four times a day (QID) | ORAL | 0 refills | Status: DC | PRN

## 2017-08-31 MED ORDER — SENNA 8.6 MG PO TABS: 2.0000 | ORAL_TABLET | Freq: Every day | ORAL | 0 refills | Status: DC

## 2017-08-31 MED ORDER — DOCUSATE SODIUM 100 MG PO CAPS: 100.0000 mg | ORAL_CAPSULE | Freq: Two times a day (BID) | ORAL | 1 refills | Status: DC

## 2017-08-31 MED ORDER — HYDROCODONE-ACETAMINOPHEN 5-325 MG PO TABS: 1.0000 | ORAL_TABLET | Freq: Four times a day (QID) | ORAL | 0 refills | Status: DC | PRN

## 2017-08-31 MED ORDER — ASPIRIN 81 MG PO CHEW: 81.0000 mg | CHEWABLE_TABLET | Freq: Two times a day (BID) | ORAL | 1 refills | Status: DC

## 2017-08-31 NOTE — Care Management Note (Signed)
Case Management Note  Patient Details  Name: Nathan [name] MRN: [id number] Date of Birth: 1960-12-30  Subjective/Objective:   56 yr old gentleman s/p right total hip arthroplasty.                  Action/Plan: Case manager spoke with patient concerning discharge plan. Dr. Lyla Glassing says that patient will not need Home Health therapy, he is to continue to do HEP. Patient is in agreement. CM has ordered cane for patient.    Expected Discharge Date:  08/31/17               Expected Discharge Plan:   Home/selfcare  In-House Referral:  NA  Discharge planning Services  CM Consult  Post Acute Care Choice:    Choice offered to:    DME Arranged:  Gilford Rile rolling, Cane DME Agency:  [place]:  NA  HH Agency:     Status of Service:  In process, will continue to follow  If discussed at Long Length of Stay Meetings, dates discussed:    Additional Comments:  Ninfa Meeker, RN 08/31/2017, 11:00 AM

## 2017-08-31 NOTE — Evaluation (Signed)
Physical Therapy Evaluation Patient Details Name: Nathan [name] MRN: [id number] DOB: 05/21/1961 Today's Date: 08/31/2017   History of Present Illness  Pt is a 56 y.o. male now s/p elective THA on 08/30/17. Pertinent PMH includes kidney stones, arthritis.  Clinical Impression  Patient evaluated by Physical Therapy with no further acute PT needs identified. PTA, pt indep and lives at home with family who will be available for 24/7 assist if needed Mod indep for transfers and amb with RW; mod indep for stairs with R-rail. All education has been completed and the patient has no further questions. See below for any follow-up Physical Therapy or equipment needs. PT is signing off. Thank you for this referral.    Follow Up Recommendations DC plan and follow up therapy as arranged by surgeon;Home health PT    Equipment Recommendations  Rolling walker with 5" wheels;Cane    Recommendations for Other Services       Precautions / Restrictions Precautions Precautions: None Restrictions Weight Bearing Restrictions: Yes RLE Weight Bearing: Weight bearing as tolerated      Mobility  Bed Mobility Overal bed mobility: Independent                Transfers Overall transfer level: Modified independent Equipment used: None;Rolling walker (2 wheeled)                Ambulation/Gait Ambulation/Gait assistance: Modified independent (Device/Increase time) Ambulation Distance (Feet): 1200 Feet Assistive device: Rolling walker (2 wheeled) Gait Pattern/deviations: Step-through pattern;Antalgic;Decreased stride length        Stairs Stairs: Yes Stairs assistance: Modified independent (Device/Increase time) Stair Management: One rail Right;Alternating pattern Number of Stairs: 4 General stair comments: Ascend/descended 4 steps mod indep with single rail. Educ on technique  Wheelchair Mobility    Modified Rankin (Stroke Patients Only)       Balance Overall balance assessment:  Modified Independent                                           Pertinent Vitals/Pain Pain Assessment: Faces Faces Pain Scale: Hurts a little bit Pain Descriptors / Indicators: Sore;Discomfort Pain Intervention(s): Monitored during session    Home Living Family/patient expects to be discharged to:: Private residence Living Arrangements: Spouse/significant other;Children Available Help at Discharge: Family;Available 24 hours/day Type of Home: House Home Access: Stairs to enter Entrance Stairs-Rails: Right;Left Entrance Stairs-Number of Steps: 2 Home Layout: One level Home Equipment: None      Prior Function Level of Independence: Independent               Hand Dominance        Extremity/Trunk Assessment   Upper Extremity Assessment Upper Extremity Assessment: Overall WFL for tasks assessed    Lower Extremity Assessment Lower Extremity Assessment: Overall WFL for tasks assessed       Communication   Communication: No difficulties  Cognition Arousal/Alertness: Awake/alert Behavior During Therapy: WFL for tasks assessed/performed Overall Cognitive Status: Within Functional Limits for tasks assessed                                        General Comments      Exercises Total Joint Exercises Straight Leg Raises: AROM;Both;10 reps;Seated Long Arc Quad: AROM;Both;10 reps;Seated Marching in Standing: AROM;Both;10 reps;Seated   Assessment/Plan      PT Assessment All further PT needs can be met in the next venue of care  PT Problem List Decreased strength;Decreased range of motion;Decreased activity tolerance;Decreased balance;Decreased knowledge of use of DME;Pain       PT Treatment Interventions      PT Goals (Current goals can be found in the Care Plan section)  Acute Rehab PT Goals Patient Stated Goal: Return home; start using [facility] PT Goal Formulation: With patient Time For Goal Achievement: 09/14/17 Potential to  Achieve Goals: Good    Frequency     Barriers to discharge        Co-evaluation               AM-PAC PT "6 Clicks" Daily Activity  Outcome Measure Difficulty turning over in bed (including adjusting bedclothes, sheets and blankets)?: None Difficulty moving from lying on back to sitting on the side of the bed? : None Difficulty sitting down on and standing up from a chair with arms (e.g., wheelchair, bedside commode, etc,.)?: None Help needed moving to and from a bed to chair (including a wheelchair)?: None Help needed walking in hospital room?: None Help needed climbing 3-5 steps with a railing? : None 6 Click Score: 24    End of Session Equipment Utilized During Treatment: Gait belt Activity Tolerance: Patient tolerated treatment well Patient left: in chair;with call bell/phone within reach;with nursing/sitter in room Nurse Communication: Mobility status PT Visit Diagnosis: Other abnormalities of gait and mobility (R26.89);Pain Pain - Right/Left: Right Pain - part of body: Hip    Time: 0812-0830 PT Time Calculation (min) (ACUTE ONLY): 18 min   Charges:   PT Evaluation $PT Eval Low Complexity: 1 Low     PT G Codes:       Mabeline Caras, PT, DPT Acute Rehab Services  Pager: [place] 08/31/2017, 8:34 AM

## 2017-08-31 NOTE — Progress Notes (Signed)
   Subjective:  Patient reports pain as mild to moderate.  Denies N/V/CP/SOB.  Objective:   VITALS:   Vitals:   08/30/17 1749 08/30/17 2116 08/31/17 0100 08/31/17 0647  BP: 139/75 122/65 129/72 127/80  Pulse: (!) 42 (!) 55 65 74  Resp: 16 16 16 16   Temp:  (!) 97.5 F (36.4 C) 99.3 F (37.4 C) 99.9 F (37.7 C)  TempSrc: Oral Axillary Oral Oral  SpO2: 98% 97% 96% 97%  Weight:       NAD ABD soft Sensation intact distally Intact pulses distally Dorsiflexion/Plantar flexion intact Incision: dressing C/D/I Compartment soft   Lab Results  Component Value Date   WBC 14.8 (H) 08/31/2017   HGB 12.3 (L) 08/31/2017   HCT 37.3 (L) 08/31/2017   MCV 94.9 08/31/2017   PLT 156 08/31/2017   BMET    Component Value Date/Time   NA 137 08/31/2017 0539   K 3.7 08/31/2017 0539   CL 102 08/31/2017 0539   CO2 28 08/31/2017 0539   GLUCOSE 116 (H) 08/31/2017 0539   BUN 7 08/31/2017 0539   CREATININE 0.78 08/31/2017 0539   CALCIUM 8.5 (L) 08/31/2017 0539   GFRNONAA >60 08/31/2017 0539   GFRAA >60 08/31/2017 0539     Assessment/Plan: 1 Day Post-Op   Principal Problem:   Osteoarthritis of right hip Active Problems:   Bradycardia   WBAT with walker DVT ppx: ASA, SCDs, tEDs PT/OT PO pain control Dispo: D/C home with HHPT   Nathan [name], Horald Pollen 08/31/2017, 7:42 AM   Rod Can, MD Cell [phone]

## 2017-08-31 NOTE — Discharge Summary (Signed)
Physician Discharge Summary  Patient ID: Nathan [name] MRN: [id number] DOB/AGE: [birth year] 56 y.o.  Admit date: 08/30/2017 Discharge date: 08/31/2017  Admission Diagnoses:  Osteoarthritis of right hip  Discharge Diagnoses:  Principal Problem:   Osteoarthritis of right hip Active Problems:   Bradycardia   Past Medical History:  Diagnosis Date  . Arthritis    dengenerative joint surgery-  R hip & L hip  . History of kidney stones    laser surgery  . Kidney stones     Surgeries: Procedure(s): TOTAL HIP ARTHROPLASTY ANTERIOR APPROACH on 08/30/2017   Consultants (if any): Treatment Team:  [name], Rounding, MD  Discharged Condition: Improved  Hospital Course: Nathan [name] is an 56 y.o. male who was admitted 08/30/2017 with a diagnosis of Osteoarthritis of right hip and went to the operating room on 08/30/2017 and underwent the above named procedures.    He was given perioperative antibiotics:  Anti-infectives    Start     Dose/Rate Route Frequency Ordered Stop   08/30/17 2030  ceFAZolin (ANCEF) IVPB 1 g/50 mL premix     1 g 100 mL/hr over 30 Minutes Intravenous Every 6 hours 08/30/17 1752 08/31/17 0351   08/30/17 1100  ceFAZolin (ANCEF) IVPB 2g/100 mL premix     2 g 200 mL/hr over 30 Minutes Intravenous To Short Stay 08/27/17 1254 08/30/17 1442    .  He was given sequential compression devices, early ambulation, and ASA for DVT prophylaxis.  He benefited maximally from the hospital stay and there were no complications.    Recent vital signs:  Vitals:   08/31/17 0100 08/31/17 0647  BP: 129/72 127/80  Pulse: 65 74  Resp: 16 16  Temp: 99.3 F (37.4 C) 99.9 F (37.7 C)  SpO2: 96% 97%    Recent laboratory studies:  Lab Results  Component Value Date   HGB 12.3 (L) 08/31/2017   HGB 16.2 08/18/2017   HGB 17.4 (H) 05/27/2014   Lab Results  Component Value Date   WBC 14.8 (H) 08/31/2017   PLT 156 08/31/2017   No results found for: INR Lab Results   Component Value Date   NA 137 08/31/2017   K 3.7 08/31/2017   CL 102 08/31/2017   CO2 28 08/31/2017   BUN 7 08/31/2017   CREATININE 0.78 08/31/2017   GLUCOSE 116 (H) 08/31/2017    Discharge Medications:   Allergies as of 08/31/2017   No Known Allergies     Medication List    TAKE these medications   aspirin 81 MG chewable tablet Chew 1 tablet (81 mg total) by mouth 2 (two) times daily.   docusate sodium 100 MG capsule Commonly known as:  COLACE Take 1 capsule (100 mg total) by mouth 2 (two) times daily.   HYDROcodone-acetaminophen 5-325 MG tablet Commonly known as:  NORCO/VICODIN Take 1-2 tablets by mouth every 6 (six) hours as needed (breakthrough pain).   ondansetron 4 MG tablet Commonly known as:  ZOFRAN Take 1 tablet (4 mg total) by mouth every 6 (six) hours as needed for nausea.   senna 8.6 MG Tabs tablet Commonly known as:  SENOKOT Take 2 tablets (17.2 mg total) by mouth at bedtime.            Durable Medical Equipment        Start     Ordered   08/31/17 1053  For home use only DME Cane  Once     08/31/17 1055   08/30/17 1753  DME  Walker rolling  Once    Question:  Patient needs a walker to treat with the following condition  Answer:  Status post total hip replacement, right   08/30/17 1752   08/30/17 1753  DME 3 n 1  Once     08/30/17 1752      Diagnostic Studies: Dg Pelvis Portable  Result Date: 08/30/2017 CLINICAL DATA:  Right hip arthroplasty, immediate postoperative check. EXAM: PORTABLE PELVIS 1-2 VIEWS COMPARISON:  Intraoperative fluoroscopic spot images from 08/30/2017 FINDINGS: Frontal projection of the pelvis demonstrates a right total hip arthroplasty implies with gas in the overlying soft tissues. On the initial frontal projection the very tip of the implant was excluded ; a second projection was obtained and shows the tip of the implant without an obvious fracture, although the lateral cortex was excluded on this second image. No  complicating feature is identified. There is degenerative arthropathy of the left hip with some subcortical sclerosis and small subcortical lucencies as well as marginal spurring. IMPRESSION: 1. Right total hip arthroplasty without complicating feature identified. Degenerative findings in the left hip. Electronically Signed   By: Van Clines M.D.   On: 08/30/2017 16:54   Dg C-arm 61-120 Min  Result Date: 08/30/2017 CLINICAL DATA:  Imaging for right total hip arthroplasty. EXAM: OPERATIVE RIGHT HIP (WITH PELVIS IF PERFORMED) 4 VIEWS TECHNIQUE: Fluoroscopic spot image(s) were submitted for interpretation post-operatively. COMPARISON:  None. FINDINGS: The operative images show placement of the acetabular component followed by placement of the femoral component. The prosthetic components appear well seated and aligned. There is no acute fracture or evidence of an operative complication. IMPRESSION: Well-positioned right hip arthroplasty. Electronically Signed   By: Lajean Manes M.D.   On: 08/30/2017 16:15   Dg Hip Operative Unilat W Or W/o Pelvis Right  Result Date: 08/30/2017 CLINICAL DATA:  Imaging for right total hip arthroplasty. EXAM: OPERATIVE RIGHT HIP (WITH PELVIS IF PERFORMED) 4 VIEWS TECHNIQUE: Fluoroscopic spot image(s) were submitted for interpretation post-operatively. COMPARISON:  None. FINDINGS: The operative images show placement of the acetabular component followed by placement of the femoral component. The prosthetic components appear well seated and aligned. There is no acute fracture or evidence of an operative complication. IMPRESSION: Well-positioned right hip arthroplasty. Electronically Signed   By: Lajean Manes M.D.   On: 08/30/2017 16:15    Disposition: 01-Home or Self Care  Discharge Instructions    Call MD / Call 911    Complete by:  As directed    If you experience chest pain or shortness of breath, CALL 911 and be transported to the hospital emergency room.  If you  develope a fever above 101 F, pus (white drainage) or increased drainage or redness at the wound, or calf pain, call your surgeon's office.   Constipation Prevention    Complete by:  As directed    Drink plenty of fluids.  Prune juice may be helpful.  You may use a stool softener, such as Colace (over the counter) 100 mg twice a day.  Use MiraLax (over the counter) for constipation as needed.   Diet - low sodium heart healthy    Complete by:  As directed    Driving restrictions    Complete by:  As directed    No driving for 6 weeks   Increase activity slowly as tolerated    Complete by:  As directed    Lifting restrictions    Complete by:  As directed    No lifting for  6 weeks   TED hose    Complete by:  As directed    Use stockings (TED hose) for 2 weeks on both leg(s).  You may remove them at night for sleeping.      Follow-up Information    Johnie Makki, Aaron Edelman, MD. Schedule an appointment as soon as possible for a visit in 2 weeks.   Specialty:  Orthopedic Surgery Why:  For wound re-check Contact information: [address] [place] [address] [phone]            Signed: Elie Goody 08/31/2017, 12:06 PM

## 2017-08-31 NOTE — Progress Notes (Signed)
All discharge instructions reviewed with patient, Rx's x 6 given to pt, pt transported via wheelchair with belongings and transporters x 1 at chairside.  [id number]

## 2017-11-08 ENCOUNTER — Ambulatory Visit: Payer: Self-pay | Admitting: Orthopedic Surgery

## 2017-11-10 ENCOUNTER — Ambulatory Visit: Payer: Self-pay | Admitting: Orthopedic Surgery

## 2017-11-10 NOTE — H&P (Signed)
TOTAL HIP ADMISSION H&P  Patient is admitted for left total hip arthroplasty.  Subjective:  Chief Complaint: left hip pain  HPI: Nathan [name], 56 y.o. male, has a history of pain and functional disability in the left hip(s) due to arthritis and patient has failed non-surgical conservative treatments for greater than 12 weeks to include NSAID's and/or analgesics, flexibility and strengthening excercises, use of assistive devices, weight reduction as appropriate and activity modification.  Onset of symptoms was gradual starting 3 years ago with gradually worsening course since that time.The patient noted no past surgery on the left hip(s).  Patient currently rates pain in the left hip at 10 out of 10 with activity. Patient has night pain, worsening of pain with activity and weight bearing, trendelenberg gait, pain that interfers with activities of daily living and pain with passive range of motion. Patient has evidence of subchondral cysts, subchondral sclerosis, periarticular osteophytes and joint space narrowing by imaging studies. This condition presents safety issues increasing the risk of falls. There is no current active infection.  Patient Active Problem List   Diagnosis Date Noted  . Osteoarthritis of right hip 08/30/2017  . Bradycardia    Past Medical History:  Diagnosis Date  . Arthritis    dengenerative joint surgery-  R hip & L hip  . History of kidney stones    laser surgery  . Kidney stones     Past Surgical History:  Procedure Laterality Date  . HAND SURGERY Right 2008   thumb with pin  . LITHOTRIPSY    . TOTAL HIP ARTHROPLASTY Right 08/30/2017   Procedure: TOTAL HIP ARTHROPLASTY ANTERIOR APPROACH;  Surgeon: Rod Can, MD;  Location: [place];  Service: Orthopedics;  Laterality: Right;    Current Outpatient Medications  Medication Sig Dispense Refill Last Dose  . aspirin 81 MG chewable tablet Chew 1 tablet (81 mg total) by mouth 2 (two) times daily. 60 tablet 1   .  docusate sodium (COLACE) 100 MG capsule Take 1 capsule (100 mg total) by mouth 2 (two) times daily. 60 capsule 1   . HYDROcodone-acetaminophen (NORCO/VICODIN) 5-325 MG tablet Take 1-2 tablets by mouth every 6 (six) hours as needed (breakthrough pain). 50 tablet 0   . ondansetron (ZOFRAN) 4 MG tablet Take 1 tablet (4 mg total) by mouth every 6 (six) hours as needed for nausea. 20 tablet 0   . senna (SENOKOT) 8.6 MG TABS tablet Take 2 tablets (17.2 mg total) by mouth at bedtime. 120 each 0    No current facility-administered medications for this visit.    No Known Allergies  Social History   Tobacco Use  . Smoking status: Former Smoker    Last attempt to quit: 07/28/2017    Years since quitting: 0.2  . Smokeless tobacco: Never Used  Substance Use Topics  . Alcohol use: Yes    Alcohol/week: 3.6 oz    Types: 6 Cans of beer per week    Comment: one six pack per week    Family History  Problem Relation Age of Onset  . Cancer - Colon Mother 15  . Anemia Father 80     Review of Systems  Constitutional: Negative.   HENT: Negative.   Eyes: Negative.   Respiratory: Negative.   Cardiovascular: Negative.   Gastrointestinal: Negative.   Genitourinary: Negative.   Musculoskeletal: Positive for joint pain.  Skin: Negative.   Neurological: Negative.   Endo/Heme/Allergies: Negative.   Psychiatric/Behavioral: Negative.     Objective:  Physical Exam  Vitals reviewed. Constitutional: He is oriented to person, place, and time. He appears well-developed and well-nourished.  HENT:  Head: Normocephalic and atraumatic.  Eyes: Conjunctivae and EOM are normal. Pupils are equal, round, and reactive to light.  Neck: Normal range of motion. Neck supple.  Cardiovascular: Normal rate, regular rhythm and intact distal pulses.  Respiratory: Effort normal and breath sounds normal.  GI: Soft. Bowel sounds are normal.  Genitourinary:  Genitourinary Comments: deferred  Musculoskeletal:       Left  hip: He exhibits decreased range of motion, bony tenderness and crepitus.  Neurological: He is alert and oriented to person, place, and time. He has normal reflexes.  Skin: Skin is warm and dry.  Psychiatric: He has a normal mood and affect. His behavior is normal. Judgment and thought content normal.    Vital signs in last 24 hours: @VSRANGES @  Labs:   Estimated body mass index is 21.95 kg/m as calculated from the following:   Height as of 08/18/17: 5\' 10"  (1.778 m).   Weight as of 08/30/17: 69.4 kg (153 lb).   Imaging Review Plain radiographs demonstrate severe degenerative joint disease of the left hip(s). The bone quality appears to be adequate for age and reported activity level.  Assessment/Plan:  End stage arthritis, left hip(s)  The patient history, physical examination, clinical judgement of the provider and imaging studies are consistent with end stage degenerative joint disease of the left hip(s) and total hip arthroplasty is deemed medically necessary. The treatment options including medical management, injection therapy, arthroscopy and arthroplasty were discussed at length. The risks and benefits of total hip arthroplasty were presented and reviewed. The risks due to aseptic loosening, infection, stiffness, dislocation/subluxation,  thromboembolic complications and other imponderables were discussed.  The patient acknowledged the explanation, agreed to proceed with the plan and consent was signed. Patient is being admitted for inpatient treatment for surgery, pain control, PT, OT, prophylactic antibiotics, VTE prophylaxis, progressive ambulation and ADL's and discharge planning.The patient is planning to be discharged home with HEP

## 2017-11-10 NOTE — H&P (View-Only) (Signed)
TOTAL HIP ADMISSION H&P  Patient is admitted for left total hip arthroplasty.  Subjective:  Chief Complaint: left hip pain  HPI: Nathan [name], 56 y.o. male, has a history of pain and functional disability in the left hip(s) due to arthritis and patient has failed non-surgical conservative treatments for greater than 12 weeks to include NSAID's and/or analgesics, flexibility and strengthening excercises, use of assistive devices, weight reduction as appropriate and activity modification.  Onset of symptoms was gradual starting 3 years ago with gradually worsening course since that time.The patient noted no past surgery on the left hip(s).  Patient currently rates pain in the left hip at 10 out of 10 with activity. Patient has night pain, worsening of pain with activity and weight bearing, trendelenberg gait, pain that interfers with activities of daily living and pain with passive range of motion. Patient has evidence of subchondral cysts, subchondral sclerosis, periarticular osteophytes and joint space narrowing by imaging studies. This condition presents safety issues increasing the risk of falls. There is no current active infection.  Patient Active Problem List   Diagnosis Date Noted  . Osteoarthritis of right hip 08/30/2017  . Bradycardia    Past Medical History:  Diagnosis Date  . Arthritis    dengenerative joint surgery-  R hip & L hip  . History of kidney stones    laser surgery  . Kidney stones     Past Surgical History:  Procedure Laterality Date  . HAND SURGERY Right 2008   thumb with pin  . LITHOTRIPSY    . TOTAL HIP ARTHROPLASTY Right 08/30/2017   Procedure: TOTAL HIP ARTHROPLASTY ANTERIOR APPROACH;  Surgeon: Rod Can, MD;  Location: [place];  Service: Orthopedics;  Laterality: Right;    Current Outpatient Medications  Medication Sig Dispense Refill Last Dose  . aspirin 81 MG chewable tablet Chew 1 tablet (81 mg total) by mouth 2 (two) times daily. 60 tablet 1   .  docusate sodium (COLACE) 100 MG capsule Take 1 capsule (100 mg total) by mouth 2 (two) times daily. 60 capsule 1   . HYDROcodone-acetaminophen (NORCO/VICODIN) 5-325 MG tablet Take 1-2 tablets by mouth every 6 (six) hours as needed (breakthrough pain). 50 tablet 0   . ondansetron (ZOFRAN) 4 MG tablet Take 1 tablet (4 mg total) by mouth every 6 (six) hours as needed for nausea. 20 tablet 0   . senna (SENOKOT) 8.6 MG TABS tablet Take 2 tablets (17.2 mg total) by mouth at bedtime. 120 each 0    No current facility-administered medications for this visit.    No Known Allergies  Social History   Tobacco Use  . Smoking status: Former Smoker    Last attempt to quit: 07/28/2017    Years since quitting: 0.2  . Smokeless tobacco: Never Used  Substance Use Topics  . Alcohol use: Yes    Alcohol/week: 3.6 oz    Types: 6 Cans of beer per week    Comment: one six pack per week    Family History  Problem Relation Age of Onset  . Cancer - Colon Mother 27  . Anemia Father 80     Review of Systems  Constitutional: Negative.   HENT: Negative.   Eyes: Negative.   Respiratory: Negative.   Cardiovascular: Negative.   Gastrointestinal: Negative.   Genitourinary: Negative.   Musculoskeletal: Positive for joint pain.  Skin: Negative.   Neurological: Negative.   Endo/Heme/Allergies: Negative.   Psychiatric/Behavioral: Negative.     Objective:  Physical Exam  Vitals reviewed. Constitutional: He is oriented to person, place, and time. He appears well-developed and well-nourished.  HENT:  Head: Normocephalic and atraumatic.  Eyes: Conjunctivae and EOM are normal. Pupils are equal, round, and reactive to light.  Neck: Normal range of motion. Neck supple.  Cardiovascular: Normal rate, regular rhythm and intact distal pulses.  Respiratory: Effort normal and breath sounds normal.  GI: Soft. Bowel sounds are normal.  Genitourinary:  Genitourinary Comments: deferred  Musculoskeletal:       Left  hip: He exhibits decreased range of motion, bony tenderness and crepitus.  Neurological: He is alert and oriented to person, place, and time. He has normal reflexes.  Skin: Skin is warm and dry.  Psychiatric: He has a normal mood and affect. His behavior is normal. Judgment and thought content normal.    Vital signs in last 24 hours: @VSRANGES @  Labs:   Estimated body mass index is 21.95 kg/m as calculated from the following:   Height as of 08/18/17: 5\' 10"  (1.778 m).   Weight as of 08/30/17: 69.4 kg (153 lb).   Imaging Review Plain radiographs demonstrate severe degenerative joint disease of the left hip(s). The bone quality appears to be adequate for age and reported activity level.  Assessment/Plan:  End stage arthritis, left hip(s)  The patient history, physical examination, clinical judgement of the provider and imaging studies are consistent with end stage degenerative joint disease of the left hip(s) and total hip arthroplasty is deemed medically necessary. The treatment options including medical management, injection therapy, arthroscopy and arthroplasty were discussed at length. The risks and benefits of total hip arthroplasty were presented and reviewed. The risks due to aseptic loosening, infection, stiffness, dislocation/subluxation,  thromboembolic complications and other imponderables were discussed.  The patient acknowledged the explanation, agreed to proceed with the plan and consent was signed. Patient is being admitted for inpatient treatment for surgery, pain control, PT, OT, prophylactic antibiotics, VTE prophylaxis, progressive ambulation and ADL's and discharge planning.The patient is planning to be discharged home with HEP

## 2017-11-17 NOTE — Pre-Procedure Instructions (Signed)
Nathan [name]  11/17/2017      [facility] - [address], [place] - [address] AT [address] [place] [address] Phone: [phone] Fax: [phone]    Your procedure is scheduled on Monday January 7.  Report to [facility] Admitting at 12:30 A.M.  Call this number if you have problems the morning of surgery:  [phone]   Remember:  Do not eat food or drink liquids after midnight.  Take these medicines the morning of surgery with A SIP OF WATER: NONE  7 days prior to surgery STOP taking any Aspirin(unless otherwise instructed by your surgeon), Aleve, Naproxen, Ibuprofen, Motrin, Advil, Goody's, BC's, all herbal medications, fish oil, and all vitamins    Do not wear jewelry  Do not wear lotions, powders, or colognes, or deodorant.  Do not shave 48 hours prior to surgery.  Men may shave face and neck.  Do not bring valuables to the hospital.  [facility] is not responsible for any belongings or valuables.  Contacts, dentures or bridgework may not be worn into surgery.  Leave your suitcase in the car.  After surgery it may be brought to your room.  For patients admitted to the hospital, discharge time will be determined by your treatment team.  Patients discharged the day of surgery will not be allowed to drive home.   Special instructions:    [place]- Preparing For Surgery  Before surgery, you can play an important role. Because skin is not sterile, your skin needs to be as free of germs as possible. You can reduce the number of germs on your skin by washing with CHG (chlorahexidine gluconate) Soap before surgery.  CHG is an antiseptic cleaner which kills germs and bonds with the skin to continue killing germs even after washing.  Please do not use if you have an allergy to CHG or antibacterial soaps. If your skin becomes reddened/irritated stop using the CHG.  Do not shave (including legs and  underarms) for at least 48 hours prior to first CHG shower. It is OK to shave your face.  Please follow these instructions carefully.   1. Shower the NIGHT BEFORE SURGERY and the MORNING OF SURGERY with CHG.   2. If you chose to wash your hair, wash your hair first as usual with your normal shampoo.  3. After you shampoo, rinse your hair and body thoroughly to remove the shampoo.  4. Use CHG as you would any other liquid soap. You can apply CHG directly to the skin and wash gently with a scrungie or a clean washcloth.   5. Apply the CHG Soap to your body ONLY FROM THE NECK DOWN.  Do not use on open wounds or open sores. Avoid contact with your eyes, ears, mouth and genitals (private parts). Wash Face and genitals (private parts)  with your normal soap.  6. Wash thoroughly, paying special attention to the area where your surgery will be performed.  7. Thoroughly rinse your body with warm water from the neck down.  8. DO NOT shower/wash with your normal soap after using and rinsing off the CHG Soap.  9. Pat yourself dry with a CLEAN TOWEL.  10. Wear CLEAN PAJAMAS to bed the night before surgery, wear comfortable clothes the morning of surgery  11. Place CLEAN SHEETS on your bed the night of your first shower and DO NOT SLEEP WITH PETS.  Day of Surgery: Do not apply any deodorants/lotions. Please wear clean clothes to the hospital/surgery center.      Please read over the following fact sheets that you were given. Coughing and Deep Breathing, Total Joint Packet, MRSA Information and Surgical Site Infection Prevention

## 2017-11-18 ENCOUNTER — Encounter (HOSPITAL_COMMUNITY): Payer: Self-pay

## 2017-11-18 ENCOUNTER — Encounter (HOSPITAL_COMMUNITY)
Admission: RE | Admit: 2017-11-18 | Discharge: 2017-11-18 | Disposition: A | Discharge status: Home / Self Care | Payer: Non-veteran care | Source: Ambulatory Visit | Attending: Orthopedic Surgery | Admitting: Orthopedic Surgery

## 2017-11-18 ENCOUNTER — Other Ambulatory Visit: Payer: Self-pay

## 2017-11-18 DIAGNOSIS — Z01812 Encounter for preprocedural laboratory examination: Secondary | ICD-10-CM | POA: Diagnosis not present

## 2017-11-18 HISTORY — DX: Family history of other specified conditions: Z84.89

## 2017-11-18 LAB — COMPREHENSIVE METABOLIC PANEL
ALBUMIN: 4.1 g/dL (ref 3.5–5.0)
ALK PHOS: 101 U/L (ref 38–126)
ALT: 53 U/L (ref 17–63)
AST: 47 U/L — ABNORMAL HIGH (ref 15–41)
Anion gap: 9 (ref 5–15)
BILIRUBIN TOTAL: 0.6 mg/dL (ref 0.3–1.2)
BUN: 9 mg/dL (ref 6–20)
CALCIUM: 9.6 mg/dL (ref 8.9–10.3)
CO2: 31 mmol/L (ref 22–32)
CREATININE: 0.83 mg/dL (ref 0.61–1.24)
Chloride: 102 mmol/L (ref 101–111)
GFR calc Af Amer: >60 mL/min (ref >60)
GFR calc non Af Amer: >60 mL/min (ref >60)
GLUCOSE: 96 mg/dL (ref 65–99)
Potassium: 3.7 mmol/L (ref 3.5–5.1)
Sodium: 142 mmol/L (ref 135–145)
TOTAL PROTEIN: 7.1 g/dL (ref 6.5–8.1)

## 2017-11-18 LAB — CBC
HCT: 45.6 % (ref 39.0–52.0)
HEMOGLOBIN: 15.3 g/dL (ref 13.0–17.0)
MCH: 32.1 pg (ref 26.0–34.0)
MCHC: 33.6 g/dL (ref 30.0–36.0)
MCV: 95.8 fL (ref 78.0–100.0)
PLATELETS: 200 10*3/uL (ref 150–400)
RBC: 4.76 MIL/uL (ref 4.22–5.81)
RDW: 12.7 % (ref 11.5–15.5)
WBC: 10.8 10*3/uL — ABNORMAL HIGH (ref 4.0–10.5)

## 2017-11-18 LAB — SURGICAL PCR SCREEN
MRSA, PCR: NEGATIVE
Staphylococcus aureus: NEGATIVE

## 2017-11-18 LAB — TYPE AND SCREEN
ABO/RH(D): O POS
ANTIBODY SCREEN: NEGATIVE

## 2017-11-18 NOTE — Pre-Procedure Instructions (Signed)
Jerrard G Martinique  11/18/2017      [facility] [address], [place] - [address] AT [address] [place] [address] Phone: [phone] Fax: [phone]    Your procedure is scheduled on Monday January 7.  Report to [facility] Admitting at 12:30 PM               Your surgery or procedure is scheduled for 2:30 PM    Call this number if you have problems the morning of surgery: [phone]                For any other questions, please call [phone], Monday - Friday 8 AM - 4 PM.     Remember:  Do not eat food or drink liquids after midnight unday, January 6. Take these medicines the morning of surgery with A SIP OF WATER: NONE  7 days prior to surgery STOP taking any Aspirin(unless otherwise instructed by your surgeon), Aleve, Naproxen, Ibuprofen, Motrin, Advil, Goody's, BC's, all herbal medications, fish oil, and all vitamins    Do not wear jewelry  Do not wear lotions, powders, or colognes, or deodorant.  Do not shave 48 hours prior to surgery.  Men may shave face and neck.  Do not bring valuables to the hospital.  [facility] is not responsible for any belongings or valuables.  Contacts, dentures or bridgework may not be worn into surgery.  Leave your suitcase in the car.  After surgery it may be brought to your room.  For patients admitted to the hospital, discharge time will be determined by your treatment team.  Patients discharged the day of surgery will not be allowed to drive home.   Special instructions:    - Preparing For Surgery  Before surgery, you can play an important role. Because skin is not sterile, your skin needs to be as free of germs as possible. You can reduce the number of germs on your skin by washing with CHG (chlorahexidine gluconate) Soap before surgery.  CHG is an antiseptic cleaner which kills germs and bonds with the skin to continue killing germs even after  washing.  Please do not use if you have an allergy to CHG or antibacterial soaps. If your skin becomes reddened/irritated stop using the CHG.  Do not shave (including legs and underarms) for at least 48 hours prior to first CHG shower. It is OK to shave your face.  Please follow these instructions carefully.   1. Shower the NIGHT BEFORE SURGERY and the MORNING OF SURGERY with CHG.   2. If you chose to wash your hair, wash your hair first as usual with your normal shampoo.  3. After you shampoo, rinse your hair and body thoroughly to remove the shampoo.          Wash your face and private area with the soap you use at home, then rinse.  4. Use CHG as you would any other liquid soap. You can apply CHG directly to the skin and wash gently with a scrungie or a clean washcloth.   5. Apply the CHG Soap to your body ONLY FROM THE NECK DOWN.  Do not use on open wounds or open sores. Avoid contact with your eyes, ears, mouth and genitals (private parts). Wash Face and genitals (private parts)  with your normal soap.  6. Wash thoroughly, paying special attention to the area where your surgery  will be performed.  7. Thoroughly rinse your body with warm water from the neck down.  8. DO NOT shower/wash with your normal soap after using and rinsing off the CHG Soap.  9. Pat yourself dry with a CLEAN TOWEL.  10. Wear CLEAN PAJAMAS to bed the night before surgery, wear comfortable clothes the morning of surgery  11. Place CLEAN SHEETS on your bed the night of your first shower and DO NOT SLEEP WITH PETS.   Day of Surgery: Shower as above Do not apply any deodorants/lotions. Please wear clean clothes to the hospital/surgery center.     Please read over the following fact sheets that you were given. Coughing and Deep Breathing, Total Joint Packet, MRSA Information and Surgical Site Infection Prevention

## 2017-11-26 MED ORDER — ACETAMINOPHEN 10 MG/ML IV SOLN
1000.0000 mg | INTRAVENOUS | Status: AC
  Administered 2017-11-29: 1000 mg via INTRAVENOUS
  Filled 2017-11-26: qty 100

## 2017-11-26 MED ORDER — SODIUM CHLORIDE 0.9 % IV SOLN: INTRAVENOUS | Status: DC

## 2017-11-26 MED ORDER — CEFAZOLIN SODIUM-DEXTROSE 2-4 GM/100ML-% IV SOLN
2.0000 g | INTRAVENOUS | Status: AC
  Administered 2017-11-29: 2 g via INTRAVENOUS
  Filled 2017-11-26: qty 100

## 2017-11-26 MED ORDER — SODIUM CHLORIDE 0.9 % IV SOLN
1000.0000 mg | INTRAVENOUS | Status: AC
  Administered 2017-11-29: 1000 mg via INTRAVENOUS
  Filled 2017-11-26: qty 10

## 2017-11-29 ENCOUNTER — Inpatient Hospital Stay (HOSPITAL_COMMUNITY): Payer: No Typology Code available for payment source

## 2017-11-29 ENCOUNTER — Inpatient Hospital Stay (HOSPITAL_COMMUNITY): Payer: No Typology Code available for payment source | Admitting: Certified Registered Nurse Anesthetist

## 2017-11-29 ENCOUNTER — Encounter (HOSPITAL_COMMUNITY): Payer: Self-pay | Admitting: Certified Registered Nurse Anesthetist

## 2017-11-29 ENCOUNTER — Other Ambulatory Visit: Payer: Self-pay

## 2017-11-29 ENCOUNTER — Inpatient Hospital Stay (HOSPITAL_COMMUNITY)
Admission: RE | Admit: 2017-11-29 | Discharge: 2017-11-30 | DRG: 470 | Disposition: A | Discharge status: Home / Self Care | Payer: No Typology Code available for payment source | Source: Ambulatory Visit | Attending: Orthopedic Surgery | Admitting: Orthopedic Surgery

## 2017-11-29 ENCOUNTER — Encounter (HOSPITAL_COMMUNITY)
Admission: RE | Disposition: A | Discharge status: Home / Self Care | Payer: Self-pay | Source: Ambulatory Visit | Attending: Orthopedic Surgery

## 2017-11-29 DIAGNOSIS — Z87891 Personal history of nicotine dependence: Secondary | ICD-10-CM | POA: Diagnosis not present

## 2017-11-29 DIAGNOSIS — Z419 Encounter for procedure for purposes other than remedying health state, unspecified: Secondary | ICD-10-CM

## 2017-11-29 DIAGNOSIS — Z7982 Long term (current) use of aspirin: Secondary | ICD-10-CM | POA: Diagnosis not present

## 2017-11-29 DIAGNOSIS — Z09 Encounter for follow-up examination after completed treatment for conditions other than malignant neoplasm: Secondary | ICD-10-CM

## 2017-11-29 DIAGNOSIS — M1612 Unilateral primary osteoarthritis, left hip: Principal | ICD-10-CM | POA: Diagnosis present

## 2017-11-29 DIAGNOSIS — Z96641 Presence of right artificial hip joint: Secondary | ICD-10-CM | POA: Diagnosis present

## 2017-11-29 DIAGNOSIS — Z800 Family history of malignant neoplasm of digestive organs: Secondary | ICD-10-CM | POA: Diagnosis not present

## 2017-11-29 DIAGNOSIS — Z87442 Personal history of urinary calculi: Secondary | ICD-10-CM | POA: Diagnosis not present

## 2017-11-29 HISTORY — PX: TOTAL HIP ARTHROPLASTY: SHX124

## 2017-11-29 SURGERY — ARTHROPLASTY, HIP, TOTAL, ANTERIOR APPROACH
Anesthesia: Monitor Anesthesia Care | Site: Hip | Laterality: Left

## 2017-11-29 MED ORDER — BUPIVACAINE-EPINEPHRINE (PF) 0.5% -1:200000 IJ SOLN
INTRAMUSCULAR | Status: AC
  Filled 2017-11-29: qty 30

## 2017-11-29 MED ORDER — CHLORHEXIDINE GLUCONATE 4 % EX LIQD: 60.0000 mL | Freq: Once | CUTANEOUS | Status: DC

## 2017-11-29 MED ORDER — POLYETHYLENE GLYCOL 3350 17 G PO PACK: 17.0000 g | PACK | Freq: Every day | ORAL | Status: DC | PRN

## 2017-11-29 MED ORDER — PROPOFOL 1000 MG/100ML IV EMUL
INTRAVENOUS | Status: AC
  Filled 2017-11-29: qty 200

## 2017-11-29 MED ORDER — OXYCODONE HCL 5 MG PO TABS: 5.0000 mg | ORAL_TABLET | Freq: Once | ORAL | Status: DC | PRN

## 2017-11-29 MED ORDER — DEXAMETHASONE SODIUM PHOSPHATE 10 MG/ML IJ SOLN
10.0000 mg | Freq: Once | INTRAMUSCULAR | Status: AC
  Administered 2017-11-30: 10 mg via INTRAVENOUS
  Filled 2017-11-29: qty 1

## 2017-11-29 MED ORDER — PROPOFOL 10 MG/ML IV BOLUS
INTRAVENOUS | Status: AC
  Filled 2017-11-29: qty 20

## 2017-11-29 MED ORDER — METOCLOPRAMIDE HCL 5 MG PO TABS: 5.0000 mg | ORAL_TABLET | Freq: Three times a day (TID) | ORAL | Status: DC | PRN

## 2017-11-29 MED ORDER — SODIUM CHLORIDE 0.9 % IV SOLN
INTRAVENOUS | Status: DC
  Administered 2017-11-29: 21:00:00 via INTRAVENOUS

## 2017-11-29 MED ORDER — EPHEDRINE SULFATE 50 MG/ML IJ SOLN
INTRAMUSCULAR | Status: DC | PRN
  Administered 2017-11-29: 5 mg via INTRAVENOUS

## 2017-11-29 MED ORDER — SODIUM CHLORIDE 0.9 % IJ SOLN
INTRAMUSCULAR | Status: DC | PRN
  Administered 2017-11-29: 10 mL

## 2017-11-29 MED ORDER — MENTHOL 3 MG MT LOZG: 1.0000 | LOZENGE | OROMUCOSAL | Status: DC | PRN

## 2017-11-29 MED ORDER — PHENOL 1.4 % MT LIQD: 1.0000 | OROMUCOSAL | Status: DC | PRN

## 2017-11-29 MED ORDER — ONDANSETRON HCL 4 MG/2ML IJ SOLN
INTRAMUSCULAR | Status: AC
  Filled 2017-11-29: qty 8

## 2017-11-29 MED ORDER — LIDOCAINE 2% (20 MG/ML) 5 ML SYRINGE
INTRAMUSCULAR | Status: AC
  Filled 2017-11-29: qty 30

## 2017-11-29 MED ORDER — FENTANYL CITRATE (PF) 250 MCG/5ML IJ SOLN
INTRAMUSCULAR | Status: DC | PRN
  Administered 2017-11-29 (×2): 50 ug via INTRAVENOUS

## 2017-11-29 MED ORDER — FENTANYL CITRATE (PF) 100 MCG/2ML IJ SOLN
25.0000 ug | INTRAMUSCULAR | Status: DC | PRN
  Administered 2017-11-29 (×2): 50 ug via INTRAVENOUS

## 2017-11-29 MED ORDER — ASPIRIN 81 MG PO CHEW
81.0000 mg | CHEWABLE_TABLET | Freq: Two times a day (BID) | ORAL | Status: DC
  Administered 2017-11-29 – 2017-11-30 (×2): 81 mg via ORAL
  Filled 2017-11-29 (×2): qty 1

## 2017-11-29 MED ORDER — ACETAMINOPHEN 650 MG RE SUPP: 650.0000 mg | RECTAL | Status: DC | PRN

## 2017-11-29 MED ORDER — BUPIVACAINE-EPINEPHRINE (PF) 0.5% -1:200000 IJ SOLN
INTRAMUSCULAR | Status: DC | PRN
  Administered 2017-11-29: 30 mL via PERINEURAL

## 2017-11-29 MED ORDER — ONDANSETRON HCL 4 MG PO TABS: 4.0000 mg | ORAL_TABLET | Freq: Four times a day (QID) | ORAL | Status: DC | PRN

## 2017-11-29 MED ORDER — KETOROLAC TROMETHAMINE 15 MG/ML IJ SOLN
15.0000 mg | Freq: Four times a day (QID) | INTRAMUSCULAR | Status: DC
  Administered 2017-11-29 – 2017-11-30 (×3): 15 mg via INTRAVENOUS
  Filled 2017-11-29 (×2): qty 1

## 2017-11-29 MED ORDER — ROCURONIUM BROMIDE 10 MG/ML (PF) SYRINGE
PREFILLED_SYRINGE | INTRAVENOUS | Status: AC
  Filled 2017-11-29: qty 5

## 2017-11-29 MED ORDER — KETOROLAC TROMETHAMINE 30 MG/ML IJ SOLN
INTRAMUSCULAR | Status: DC | PRN
  Administered 2017-11-29: 30 mg

## 2017-11-29 MED ORDER — SODIUM CHLORIDE 0.9 % IR SOLN
Status: DC | PRN
  Administered 2017-11-29: 3000 mL

## 2017-11-29 MED ORDER — HYDROMORPHONE HCL 1 MG/ML IJ SOLN
0.5000 mg | INTRAMUSCULAR | Status: DC | PRN
  Administered 2017-11-29: 2 mg via INTRAVENOUS
  Filled 2017-11-29: qty 2

## 2017-11-29 MED ORDER — DEXAMETHASONE SODIUM PHOSPHATE 10 MG/ML IJ SOLN
INTRAMUSCULAR | Status: AC
  Filled 2017-11-29: qty 3

## 2017-11-29 MED ORDER — SENNA 8.6 MG PO TABS
2.0000 | ORAL_TABLET | Freq: Every day | ORAL | Status: DC
  Administered 2017-11-29: 17.2 mg via ORAL
  Filled 2017-11-29: qty 2

## 2017-11-29 MED ORDER — ALUM & MAG HYDROXIDE-SIMETH 200-200-20 MG/5ML PO SUSP: 30.0000 mL | ORAL | Status: DC | PRN

## 2017-11-29 MED ORDER — DOCUSATE SODIUM 100 MG PO CAPS
100.0000 mg | ORAL_CAPSULE | Freq: Two times a day (BID) | ORAL | Status: DC
  Administered 2017-11-29 – 2017-11-30 (×2): 100 mg via ORAL
  Filled 2017-11-29 (×2): qty 1

## 2017-11-29 MED ORDER — CEFAZOLIN SODIUM-DEXTROSE 1-4 GM/50ML-% IV SOLN
1.0000 g | Freq: Four times a day (QID) | INTRAVENOUS | Status: AC
  Administered 2017-11-29 – 2017-11-30 (×2): 1 g via INTRAVENOUS
  Filled 2017-11-29 (×2): qty 50

## 2017-11-29 MED ORDER — ACETAMINOPHEN 325 MG PO TABS: 650.0000 mg | ORAL_TABLET | ORAL | Status: DC | PRN

## 2017-11-29 MED ORDER — GLYCOPYRROLATE 0.2 MG/ML IJ SOLN
INTRAMUSCULAR | Status: DC | PRN
  Administered 2017-11-29: 0.2 mg via INTRAVENOUS

## 2017-11-29 MED ORDER — ONDANSETRON HCL 4 MG/2ML IJ SOLN: 4.0000 mg | Freq: Four times a day (QID) | INTRAMUSCULAR | Status: DC | PRN

## 2017-11-29 MED ORDER — PHENYLEPHRINE HCL 10 MG/ML IJ SOLN
INTRAMUSCULAR | Status: DC | PRN
  Administered 2017-11-29: 20 ug/min via INTRAVENOUS

## 2017-11-29 MED ORDER — LIDOCAINE HCL (CARDIAC) 20 MG/ML IV SOLN
INTRAVENOUS | Status: DC | PRN
  Administered 2017-11-29: 70 mg via INTRAVENOUS

## 2017-11-29 MED ORDER — TRANEXAMIC ACID 1000 MG/10ML IV SOLN
1000.0000 mg | Freq: Once | INTRAVENOUS | Status: AC
  Administered 2017-11-29: 1000 mg via INTRAVENOUS
  Filled 2017-11-29: qty 10

## 2017-11-29 MED ORDER — HYDROCODONE-ACETAMINOPHEN 5-325 MG PO TABS
2.0000 | ORAL_TABLET | ORAL | Status: DC | PRN
  Administered 2017-11-29 – 2017-11-30 (×4): 2 via ORAL
  Filled 2017-11-29 (×3): qty 2

## 2017-11-29 MED ORDER — PROPOFOL 500 MG/50ML IV EMUL
INTRAVENOUS | Status: DC | PRN
  Administered 2017-11-29: 75 ug/kg/min via INTRAVENOUS

## 2017-11-29 MED ORDER — PHENYLEPHRINE 40 MCG/ML (10ML) SYRINGE FOR IV PUSH (FOR BLOOD PRESSURE SUPPORT)
PREFILLED_SYRINGE | INTRAVENOUS | Status: AC
  Filled 2017-11-29: qty 10

## 2017-11-29 MED ORDER — MIDAZOLAM HCL 2 MG/2ML IJ SOLN
INTRAMUSCULAR | Status: AC
  Filled 2017-11-29: qty 2

## 2017-11-29 MED ORDER — HYDROCODONE-ACETAMINOPHEN 5-325 MG PO TABS: 1.0000 | ORAL_TABLET | ORAL | Status: DC | PRN

## 2017-11-29 MED ORDER — EPHEDRINE 5 MG/ML INJ
INTRAVENOUS | Status: AC
  Filled 2017-11-29: qty 20

## 2017-11-29 MED ORDER — 0.9 % SODIUM CHLORIDE (POUR BTL) OPTIME
TOPICAL | Status: DC | PRN
  Administered 2017-11-29: 1000 mL

## 2017-11-29 MED ORDER — BUPIVACAINE IN DEXTROSE 0.75-8.25 % IT SOLN
INTRATHECAL | Status: DC | PRN
  Administered 2017-11-29: 2 mL via INTRATHECAL

## 2017-11-29 MED ORDER — MIDAZOLAM HCL 2 MG/2ML IJ SOLN
INTRAMUSCULAR | Status: DC | PRN
  Administered 2017-11-29: 2 mg via INTRAVENOUS

## 2017-11-29 MED ORDER — ONDANSETRON HCL 4 MG/2ML IJ SOLN
INTRAMUSCULAR | Status: DC | PRN
  Administered 2017-11-29: 4 mg via INTRAVENOUS

## 2017-11-29 MED ORDER — OXYCODONE HCL 5 MG/5ML PO SOLN: 5.0000 mg | Freq: Once | ORAL | Status: DC | PRN

## 2017-11-29 MED ORDER — FENTANYL CITRATE (PF) 100 MCG/2ML IJ SOLN
INTRAMUSCULAR | Status: AC
  Administered 2017-11-29: 50 ug via INTRAVENOUS
  Filled 2017-11-29: qty 2

## 2017-11-29 MED ORDER — METHOCARBAMOL 500 MG PO TABS
ORAL_TABLET | ORAL | Status: AC
  Administered 2017-11-29: 500 mg via ORAL
  Filled 2017-11-29: qty 1

## 2017-11-29 MED ORDER — METOCLOPRAMIDE HCL 5 MG/ML IJ SOLN: 5.0000 mg | Freq: Three times a day (TID) | INTRAMUSCULAR | Status: DC | PRN

## 2017-11-29 MED ORDER — KETOROLAC TROMETHAMINE 15 MG/ML IJ SOLN
INTRAMUSCULAR | Status: AC
  Administered 2017-11-29: 15 mg via INTRAVENOUS
  Filled 2017-11-29: qty 1

## 2017-11-29 MED ORDER — POVIDONE-IODINE 10 % EX SWAB: 2.0000 "application " | Freq: Once | CUTANEOUS | Status: DC

## 2017-11-29 MED ORDER — FENTANYL CITRATE (PF) 250 MCG/5ML IJ SOLN
INTRAMUSCULAR | Status: AC
  Filled 2017-11-29: qty 5

## 2017-11-29 MED ORDER — PROPOFOL 10 MG/ML IV BOLUS
INTRAVENOUS | Status: DC | PRN
  Administered 2017-11-29: 30 mg via INTRAVENOUS
  Administered 2017-11-29: 50 mg via INTRAVENOUS

## 2017-11-29 MED ORDER — HYDROCODONE-ACETAMINOPHEN 5-325 MG PO TABS
ORAL_TABLET | ORAL | Status: AC
  Administered 2017-11-29: 2 via ORAL
  Filled 2017-11-29: qty 2

## 2017-11-29 MED ORDER — METHOCARBAMOL 1000 MG/10ML IJ SOLN
500.0000 mg | Freq: Four times a day (QID) | INTRAVENOUS | Status: DC | PRN
  Filled 2017-11-29: qty 5

## 2017-11-29 MED ORDER — DIPHENHYDRAMINE HCL 12.5 MG/5ML PO ELIX: 12.5000 mg | ORAL_SOLUTION | ORAL | Status: DC | PRN

## 2017-11-29 MED ORDER — LACTATED RINGERS IV SOLN
INTRAVENOUS | Status: DC
  Administered 2017-11-29 (×2): via INTRAVENOUS

## 2017-11-29 MED ORDER — METHOCARBAMOL 500 MG PO TABS
500.0000 mg | ORAL_TABLET | Freq: Four times a day (QID) | ORAL | Status: DC | PRN
  Administered 2017-11-29 (×2): 500 mg via ORAL
  Filled 2017-11-29: qty 1

## 2017-11-29 SURGICAL SUPPLY — 53 items
ADH SKN CLS APL DERMABOND .7 (GAUZE/BANDAGES/DRESSINGS) ×1
ALCOHOL ISOPROPYL (RUBBING) (MISCELLANEOUS) ×2 IMPLANT
BLADE CLIPPER SURG (BLADE) IMPLANT
CAPT HIP TOTAL 2 ×1 IMPLANT
CHLORAPREP W/TINT 26ML (MISCELLANEOUS) ×2 IMPLANT
COVER SURGICAL LIGHT HANDLE (MISCELLANEOUS) ×2 IMPLANT
DERMABOND ADVANCED (GAUZE/BANDAGES/DRESSINGS) ×1
DERMABOND ADVANCED .7 DNX12 (GAUZE/BANDAGES/DRESSINGS) ×2 IMPLANT
DRAPE C-ARM 42X72 X-RAY (DRAPES) ×2 IMPLANT
DRAPE STERI IOBAN 125X83 (DRAPES) ×2 IMPLANT
DRAPE U-SHAPE 47X51 STRL (DRAPES) ×6 IMPLANT
DRSG AQUACEL AG ADV 3.5X10 (GAUZE/BANDAGES/DRESSINGS) ×2 IMPLANT
ELECT BLADE 4.0 EZ CLEAN MEGAD (MISCELLANEOUS) ×2
ELECT REM PT RETURN 9FT ADLT (ELECTROSURGICAL) ×2
ELECTRODE BLDE 4.0 EZ CLN MEGD (MISCELLANEOUS) ×1 IMPLANT
ELECTRODE REM PT RTRN 9FT ADLT (ELECTROSURGICAL) ×1 IMPLANT
EVACUATOR 1/8 PVC DRAIN (DRAIN) IMPLANT
GLOVE BIO SURGEON STRL SZ8.5 (GLOVE) ×4 IMPLANT
GLOVE BIOGEL PI IND STRL 8.5 (GLOVE) ×1 IMPLANT
GLOVE BIOGEL PI INDICATOR 8.5 (GLOVE) ×1
GOWN STRL REUS W/ TWL LRG LVL3 (GOWN DISPOSABLE) ×2 IMPLANT
GOWN STRL REUS W/TWL 2XL LVL3 (GOWN DISPOSABLE) ×2 IMPLANT
GOWN STRL REUS W/TWL LRG LVL3 (GOWN DISPOSABLE) ×4
HANDPIECE INTERPULSE COAX TIP (DISPOSABLE) ×2
HOOD PEEL AWAY FACE SHEILD DIS (HOOD) ×4 IMPLANT
KIT BASIN OR (CUSTOM PROCEDURE TRAY) ×2 IMPLANT
KIT ROOM TURNOVER OR (KITS) ×2 IMPLANT
MANIFOLD NEPTUNE II (INSTRUMENTS) ×2 IMPLANT
MARKER SKIN DUAL TIP RULER LAB (MISCELLANEOUS) ×4 IMPLANT
NDL SPNL 18GX3.5 QUINCKE PK (NEEDLE) ×1 IMPLANT
NEEDLE SPNL 18GX3.5 QUINCKE PK (NEEDLE) ×2 IMPLANT
NS IRRIG 1000ML POUR BTL (IV SOLUTION) ×2 IMPLANT
PACK TOTAL JOINT (CUSTOM PROCEDURE TRAY) ×2 IMPLANT
PACK UNIVERSAL I (CUSTOM PROCEDURE TRAY) ×2 IMPLANT
PAD ARMBOARD 7.5X6 YLW CONV (MISCELLANEOUS) ×4 IMPLANT
SAW OSC TIP CART 19.5X105X1.3 (SAW) ×2 IMPLANT
SEALER BIPOLAR AQUA 6.0 (INSTRUMENTS) ×1 IMPLANT
SET HNDPC FAN SPRY TIP SCT (DISPOSABLE) ×1 IMPLANT
SOL PREP POV-IOD 4OZ 10% (MISCELLANEOUS) ×1 IMPLANT
SUT ETHIBOND NAB CT1 #1 30IN (SUTURE) ×4 IMPLANT
SUT MNCRL AB 3-0 PS2 18 (SUTURE) ×2 IMPLANT
SUT MON AB 2-0 CT1 36 (SUTURE) ×2 IMPLANT
SUT VIC AB 1 CT1 27 (SUTURE) ×4
SUT VIC AB 1 CT1 27XBRD ANBCTR (SUTURE) ×1 IMPLANT
SUT VIC AB 2-0 CT1 27 (SUTURE) ×2
SUT VIC AB 2-0 CT1 TAPERPNT 27 (SUTURE) ×1 IMPLANT
SUT VLOC 180 0 24IN GS25 (SUTURE) ×2 IMPLANT
SYR 50ML LL SCALE MARK (SYRINGE) ×2 IMPLANT
TOWEL OR 17X24 6PK STRL BLUE (TOWEL DISPOSABLE) ×2 IMPLANT
TOWEL OR 17X26 10 PK STRL BLUE (TOWEL DISPOSABLE) ×2 IMPLANT
TRAY CATH 16FR W/PLASTIC CATH (SET/KITS/TRAYS/PACK) IMPLANT
TRAY FOLEY CATH SILVER 16FR (SET/KITS/TRAYS/PACK) IMPLANT
WATER STERILE IRR 1000ML POUR (IV SOLUTION) ×3 IMPLANT

## 2017-11-29 NOTE — Discharge Instructions (Signed)
°Dr. Javar Eshbach °Joint Replacement Specialist °[facility] °[address] °[place], [place] [address] °[phone] ° ° °TOTAL HIP REPLACEMENT POSTOPERATIVE DIRECTIONS ° ° ° °Hip Rehabilitation, Guidelines Following Surgery  ° °WEIGHT BEARING °Weight bearing as tolerated with assist device (walker, cane, etc) as directed, use it as long as suggested by your surgeon or therapist, typically at least 4-6 weeks. ° °The results of a hip operation are greatly improved after range of motion and muscle strengthening exercises. Follow all safety measures which are given to protect your hip. If any of these exercises cause increased pain or swelling in your joint, decrease the amount until you are comfortable again. Then slowly increase the exercises. Call your caregiver if you have problems or questions.  ° °HOME CARE INSTRUCTIONS  °Most of the following instructions are designed to prevent the dislocation of your new hip.  °Remove items at home which could result in a fall. This includes throw rugs or furniture in walking pathways.  °Continue medications as instructed at time of discharge. °· You may have some home medications which will be placed on hold until you complete the course of blood thinner medication. °· You may start showering once you are discharged home. Do not remove your dressing. °Do not put on socks or shoes without following the instructions of your caregivers.   °Sit on chairs with arms. Use the chair arms to help push yourself up when arising.  °Arrange for the use of a toilet seat elevator so you are not sitting low.  °· Walk with walker as instructed.  °You may resume a sexual relationship in one month or when given the OK by your caregiver.  °Use walker as long as suggested by your caregivers.  °You may put full weight on your legs and walk as much as is comfortable. °Avoid periods of inactivity such as sitting longer than an hour when not asleep. This helps prevent  blood clots.  °You may return to work once you are cleared by your surgeon.  °Do not drive a car for 6 weeks or until released by your surgeon.  °Do not drive while taking narcotics.  °Wear elastic stockings for two weeks following surgery during the day but you may remove then at night.  °Make sure you keep all of your appointments after your operation with all of your doctors and caregivers. You should call the office at the above phone number and make an appointment for approximately two weeks after the date of your surgery. °Please pick up a stool softener and laxative for home use as long as you are requiring pain medications. °· ICE to the affected hip every three hours for 30 minutes at a time and then as needed for pain and swelling. Continue to use ice on the hip for pain and swelling from surgery. You may notice swelling that will progress down to the foot and ankle.  This is normal after surgery.  Elevate the leg when you are not up walking on it.   °It is important for you to complete the blood thinner medication as prescribed by your doctor. °· Continue to use the breathing machine which will help keep your temperature down.  It is common for your temperature to cycle up and down following surgery, especially at night when you are not up moving around and exerting yourself.  The breathing machine keeps your lungs expanded and your temperature down. ° °RANGE OF MOTION AND STRENGTHENING EXERCISES  °These exercises are   designed to help you keep full movement of your hip joint. Follow your caregiver's or physical therapist's instructions. Perform all exercises about fifteen times, three times per day or as directed. Exercise both hips, even if you have had only one joint replacement. These exercises can be done on a training (exercise) mat, on the floor, on a table or on a bed. Use whatever works the best and is most comfortable for you. Use music or television while you are exercising so that the exercises  are a pleasant break in your day. This will make your life better with the exercises acting as a break in routine you can look forward to.  °Lying on your back, slowly slide your foot toward your buttocks, raising your knee up off the floor. Then slowly slide your foot back down until your leg is straight again.  °Lying on your back spread your legs as far apart as you can without causing discomfort.  °Lying on your side, raise your upper leg and foot straight up from the floor as far as is comfortable. Slowly lower the leg and repeat.  °Lying on your back, tighten up the muscle in the front of your thigh (quadriceps muscles). You can do this by keeping your leg straight and trying to raise your heel off the floor. This helps strengthen the largest muscle supporting your knee.  °Lying on your back, tighten up the muscles of your buttocks both with the legs straight and with the knee bent at a comfortable angle while keeping your heel on the floor.  ° °SKILLED REHAB INSTRUCTIONS: °If the patient is transferred to a skilled rehab facility following release from the hospital, a list of the current medications will be sent to the facility for the patient to continue.  When discharged from the skilled rehab facility, please have the facility set up the patient's Home Health Physical Therapy prior to being released. Also, the skilled facility will be responsible for providing the patient with their medications at time of release from the facility to include their pain medication and their blood thinner medication. If the patient is still at the rehab facility at time of the two week follow up appointment, the skilled rehab facility will also need to assist the patient in arranging follow up appointment in our office and any transportation needs. ° °MAKE SURE YOU:  °Understand these instructions.  °Will watch your condition.  °Will get help right away if you are not doing well or get worse. ° °Pick up stool softner and  laxative for home use following surgery while on pain medications. °Do not remove your dressing. °The dressing is waterproof--it is OK to take showers. °Continue to use ice for pain and swelling after surgery. °Do not use any lotions or creams on the incision until instructed by your surgeon. °Total Hip Protocol. ° ° °

## 2017-11-29 NOTE — Interval H&P Note (Signed)
History and Physical Interval Note:  11/29/2017 1:27 PM  Nathan [name]  has presented today for surgery, with the diagnosis of Degenerative joint disease left hip  The various methods of treatment have been discussed with the patient and family. After consideration of risks, benefits and other options for treatment, the patient has consented to  Procedure(s) with comments: LEFT TOTAL HIP ARTHROPLASTY ANTERIOR APPROACH (Left) - Needs RNFA as a surgical intervention .  The patient's history has been reviewed, patient examined, no change in status, stable for surgery.  I have reviewed the patient's chart and labs.  Questions were answered to the patient's satisfaction.     [name]

## 2017-11-29 NOTE — Anesthesia Procedure Notes (Signed)
Spinal  Patient location during procedure: OR Start time: 11/29/2017 2:24 PM End time: 11/29/2017 2:29 PM Staffing Anesthesiologist: Oleta Mouse, MD Preanesthetic Checklist Completed: patient identified, surgical consent, pre-op evaluation, timeout performed, IV checked, risks and benefits discussed and monitors and equipment checked Spinal Block Patient position: sitting Prep: site prepped and draped and DuraPrep Patient monitoring: heart rate, cardiac monitor, continuous pulse ox and blood pressure Approach: midline Location: L3-4 Injection technique: single-shot Needle Needle type: Pencan  Needle gauge: 24 G Needle length: 10 cm Assessment Sensory level: T4

## 2017-11-29 NOTE — Progress Notes (Signed)
Orthopedic Tech Progress Note Patient Details:  Nathan [name] 11-17-1961 [id number]  Ortho Devices Ortho Device/Splint Location: OHF applied Ortho Device/Splint Interventions: Application   Post Interventions Patient Tolerated: Well Instructions Provided: Care of device   Kristopher Oppenheim 11/29/2017, 10:52 PM

## 2017-11-29 NOTE — Transfer of Care (Signed)
Immediate Anesthesia Transfer of Care Note  Patient: Nathan [name]  Procedure(s) Performed: LEFT TOTAL HIP ARTHROPLASTY ANTERIOR APPROACH (Left Hip)  Patient Location: PACU  Anesthesia Type:Spinal  Level of Consciousness: awake, alert , oriented and patient cooperative  Airway & Oxygen Therapy: Patient Spontanous Breathing and Patient connected to face mask oxygen  Post-op Assessment: Report given to RN and Post -op Vital signs reviewed and stable  Post vital signs: Reviewed and stable  Last Vitals:  Vitals:   11/29/17 1304  BP: (!) 167/88  Pulse: (!) 52  Resp: 18  Temp: (!) 36.4 C  SpO2: 100%    Last Pain:  Vitals:   11/29/17 1304  TempSrc: Oral      Patients Stated Pain Goal: 2 (62/69/48 [id number])  Complications: No apparent anesthesia complications

## 2017-11-29 NOTE — Op Note (Signed)
OPERATIVE REPORT  SURGEON: Rod Can, MD   ASSISTANT: April Green, RNFA.  PREOPERATIVE DIAGNOSIS: Left hip arthritis.   POSTOPERATIVE DIAGNOSIS: Left hip arthritis.   PROCEDURE: Left total hip arthroplasty, anterior approach.   IMPLANTS: Biomet Taperloc Complete Microplasty stem, size 16 x 117, std offset. Biomet G7 Cup, size 36 mm. Biomet E1 liner, size 36 mm, F, neutral. Biomet Biolox ceramic head ball, size 36 -3 mm.  ANESTHESIA:  Spinal  ESTIMATED BLOOD LOSS:-500 mL    ANTIBIOTICS: 2 g Ancef.  DRAINS: None.  COMPLICATIONS: None.   CONDITION: PACU - hemodynamically stable.   BRIEF CLINICAL NOTE: Nathan [name] is a 57 y.o. male with a long-standing history of Left hip arthritis. After failing conservative management, the patient was indicated for total hip arthroplasty. The risks, benefits, and alternatives to the procedure were explained, and the patient elected to proceed.  PROCEDURE IN DETAIL: Surgical site was marked by myself in the pre-op holding area. Once inside the operating room, spinal anesthesia was obtained, and a foley catheter was inserted. The patient was then positioned on the Hana table. All bony prominences were well padded. The hip was prepped and draped in the normal sterile surgical fashion. A time-out was called verifying side and site of surgery. The patient received IV antibiotics within 60 minutes of beginning the procedure.  The direct anterior approach to the hip was performed through the Hueter interval. Lateral femoral circumflex vessels were treated with the Auqumantys. The anterior capsule was exposed and an inverted T capsulotomy was made.The femoral neck cut was made to the level of the templated cut. A corkscrew was placed into the head and the head was removed. The femoral head was found to have eburnated bone. The head was passed to the back table and was measured.  Acetabular exposure was achieved, and the pulvinar and  labrum were excised. Sequential reaming of the acetabulum was then performed up to a size 55 mm reamer. A 56 mm cup was then opened and impacted into place at approximately 40 degrees of abduction and 20 degrees of anteversion. The final polyethylene liner was impacted into place and acetabular osteophytes were removed.   I then gained femoral exposure taking care to protect the abductors and greater trochanter. This was performed using standard external rotation, extension, and adduction. The capsule was peeled off the inner aspect of the greater trochanter, taking care to preserve the short external rotators. A cookie cutter was used to enter the femoral canal, and then the femoral canal finder was placed. Sequential broaching was performed up to a size 16. Calcar planer was used on the femoral neck remnant. I placed a std offset neck and a trial head ball. The hip was reduced. Leg lengths and offset were checked fluoroscopically. The hip was dislocated and trial components were removed. The final implants were placed, and the hip was reduced.  Fluoroscopy was used to confirm component position and leg lengths. At 90 degrees of external rotation and full extension, the hip was stable to an anterior directed force.  The wound was copiously irrigated with normal saline using pulse lavage. Marcaine solution was injected into the periarticular soft tissue. The wound was closed in layers using #1 Vicryl and V-Loc for the fascia, 2-0 Vicryl for the subcutaneous fat, 2-0 Monocryl for the deep dermal layer, 3-0 running Monocryl subcuticular stitch, and Dermabond for the skin. Once the glue was fully dried, an Aquacell Ag dressing was applied. The patient was transported to the recovery  room in stable condition. Sponge, needle, and instrument counts were correct at the end of the case x2. The patient tolerated the procedure well and there were no known complications.

## 2017-11-29 NOTE — Anesthesia Preprocedure Evaluation (Signed)
Anesthesia Evaluation  Patient identified by MRN, date of birth, ID band Patient awake    Reviewed: Allergy & Precautions, H&P , NPO status , Patient's Chart, lab work & pertinent test results  History of Anesthesia Complications Negative for: history of anesthetic complications  Airway Mallampati: II  TM Distance: >3 FB Neck ROM: full    Dental  (+) Loose,    Pulmonary Current Smoker, former smoker,    breath sounds clear to auscultation       Cardiovascular  Rhythm:regular Rate:Normal     Neuro/Psych negative neurological ROS  negative psych ROS   GI/Hepatic negative GI ROS, Neg liver ROS,   Endo/Other  negative endocrine ROS  Renal/GU negative Renal ROS     Musculoskeletal  (+) Arthritis ,   Abdominal   Peds  Hematology negative hematology ROS (+)   Anesthesia Other Findings   Reproductive/Obstetrics                             Anesthesia Physical Anesthesia Plan  ASA: II  Anesthesia Plan: Spinal and MAC   Post-op Pain Management:    Induction:   PONV Risk Score and Plan: 0 and Ondansetron  Airway Management Planned: Nasal Cannula  Additional Equipment: None  Intra-op Plan:   Post-operative Plan:   Informed Consent: I have reviewed the patients History and Physical, chart, labs and discussed the procedure including the risks, benefits and alternatives for the proposed anesthesia with the patient or authorized representative who has indicated his/her understanding and acceptance.   Dental advisory given  Plan Discussed with: CRNA and Surgeon  Anesthesia Plan Comments:         Anesthesia Quick Evaluation

## 2017-11-30 ENCOUNTER — Encounter (HOSPITAL_COMMUNITY): Payer: Self-pay | Admitting: General Practice

## 2017-11-30 ENCOUNTER — Other Ambulatory Visit: Payer: Self-pay

## 2017-11-30 LAB — BASIC METABOLIC PANEL
Anion gap: 8 (ref 5–15)
BUN: 9 mg/dL (ref 6–20)
CO2: 29 mmol/L (ref 22–32)
CREATININE: 0.75 mg/dL (ref 0.61–1.24)
Calcium: 8.7 mg/dL — ABNORMAL LOW (ref 8.9–10.3)
Chloride: 100 mmol/L — ABNORMAL LOW (ref 101–111)
Glucose, Bld: 118 mg/dL — ABNORMAL HIGH (ref 65–99)
Potassium: 4 mmol/L (ref 3.5–5.1)
SODIUM: 137 mmol/L (ref 135–145)

## 2017-11-30 LAB — CBC
HCT: 38.6 % — ABNORMAL LOW (ref 39.0–52.0)
HEMOGLOBIN: 12.2 g/dL — AB (ref 13.0–17.0)
MCH: 30.6 pg (ref 26.0–34.0)
MCHC: 31.6 g/dL (ref 30.0–36.0)
MCV: 96.7 fL (ref 78.0–100.0)
PLATELETS: 166 10*3/uL (ref 150–400)
RBC: 3.99 MIL/uL — AB (ref 4.22–5.81)
RDW: 12.4 % (ref 11.5–15.5)
WBC: 12.5 10*3/uL — ABNORMAL HIGH (ref 4.0–10.5)

## 2017-11-30 MED ORDER — PNEUMOCOCCAL VAC POLYVALENT 25 MCG/0.5ML IJ INJ: 0.5000 mL | INJECTION | INTRAMUSCULAR | Status: DC

## 2017-11-30 MED ORDER — ASPIRIN 81 MG PO CHEW: 81.0000 mg | CHEWABLE_TABLET | Freq: Two times a day (BID) | ORAL | 1 refills | Status: DC

## 2017-11-30 MED ORDER — ONDANSETRON HCL 4 MG PO TABS: 4.0000 mg | ORAL_TABLET | Freq: Four times a day (QID) | ORAL | 0 refills | Status: DC | PRN

## 2017-11-30 MED ORDER — HYDROCODONE-ACETAMINOPHEN 5-325 MG PO TABS: 1.0000 | ORAL_TABLET | Freq: Four times a day (QID) | ORAL | 0 refills | Status: DC | PRN

## 2017-11-30 NOTE — Evaluation (Signed)
Physical Therapy Evaluation Patient Details Name: Nathan [name] MRN: [id number] DOB: 1961-01-21 Today's Date: 11/30/2017   History of Present Illness  Admitted for L THA, WBAT;  has a past medical history of Arthritis, Family history of adverse reaction to anesthesia, and History of kidney stones.  Clinical Impression  Pt is s/p THA resulting in the deficits listed below (see PT Problem List). Overall managing quite well; OK for dc home from PT standpoint;  Questions solicited and answered; Pt will benefit from skilled PT to increase their independence and safety with mobility to allow discharge to the venue listed below.      Follow Up Recommendations Outpatient PT(The potential need for Outpatient PT can be addressed at Ortho follow-up appointments. )    Equipment Recommendations  Rolling walker with 5" wheels    Recommendations for Other Services       Precautions / Restrictions Precautions Precautions: None Restrictions Weight Bearing Restrictions: Yes LLE Weight Bearing: Weight bearing as tolerated      Mobility  Bed Mobility Overal bed mobility: Modified Independent                Transfers Overall transfer level: Needs assistance Equipment used: Straight cane Transfers: Sit to/from Stand Sit to Stand: Supervision            Ambulation/Gait Ambulation/Gait assistance: Supervision Ambulation Distance (Feet): 550 Feet Assistive device: Straight cane;Rolling walker (2 wheeled) Gait Pattern/deviations: Step-through pattern     General Gait Details: Cues to self-monitor for activity tolerance; initiated walk with cane, and noted some incr pain; finished walk with RW, which he used well  Stairs Stairs: Yes Stairs assistance: Min guard Stair Management: One rail Right;One rail Left;With cane;Forwards Number of Stairs: 12 General stair comments: Managing well; cues for sequence and technique  Wheelchair Mobility    Modified Rankin (Stroke Patients  Only)       Balance                                             Pertinent Vitals/Pain Pain Assessment: Faces Faces Pain Scale: Hurts little more Pain Location: L hip; occasional sharp pain which subsides quickly Pain Descriptors / Indicators: Sharp Pain Intervention(s): Monitored during session    Home Living Family/patient expects to be discharged to:: Private residence Living Arrangements: Spouse/significant other Available Help at Discharge: Family;Available 24 hours/day Type of Home: House Home Access: Stairs to enter Entrance Stairs-Rails: Psychiatric nurse of Steps: 2 Home Layout: One level Home Equipment: Cane - single point      Prior Function Level of Independence: Independent with assistive device(s)         Comments: Used a cane after RTHA last year; graduated from the cane weeks ago     Hand Dominance        Extremity/Trunk Assessment   Upper Extremity Assessment Upper Extremity Assessment: Overall WFL for tasks assessed    Lower Extremity Assessment Lower Extremity Assessment: LLE deficits/detail LLE Deficits / Details: Postop soreness as anticipated; Overal moving well, AROM in all planes of motion       Communication   Communication: No difficulties  Cognition Arousal/Alertness: Awake/alert Behavior During Therapy: WFL for tasks assessed/performed Overall Cognitive Status: Within Functional Limits for tasks assessed  General Comments General comments (skin integrity, edema, etc.): Nathan [name] showed me the exercises he did at home including heel slides, hip abduction (supine and standing), straight leg raises; I provided him with Surgical Hp information sheets    Exercises     Assessment/Plan    PT Assessment All further PT needs can be met in the next venue of care  PT Problem List Decreased strength;Decreased activity tolerance;Decreased  balance;Decreased mobility;Pain       PT Treatment Interventions      PT Goals (Current goals can be found in the Care Plan section)  Acute Rehab PT Goals Patient Stated Goal: back to work PT Goal Formulation: With patient Time For Goal Achievement: 12/07/17 Potential to Achieve Goals: Good    Frequency     Barriers to discharge        Co-evaluation               AM-PAC PT "6 Clicks" Daily Activity  Outcome Measure Difficulty turning over in bed (including adjusting bedclothes, sheets and blankets)?: None Difficulty moving from lying on back to sitting on the side of the bed? : None Difficulty sitting down on and standing up from a chair with arms (e.g., wheelchair, bedside commode, etc,.)?: None Help needed moving to and from a bed to chair (including a wheelchair)?: None Help needed walking in hospital room?: None Help needed climbing 3-5 steps with a railing? : None 6 Click Score: 24    End of Session Equipment Utilized During Treatment: Gait belt Activity Tolerance: Patient tolerated treatment well Patient left: with call bell/phone within reach(sitting EOB) Nurse Communication: Mobility status PT Visit Diagnosis: Other abnormalities of gait and mobility (R26.89);Pain Pain - Right/Left: Left Pain - part of body: Hip    Time: 2952-8413 PT Time Calculation (min) (ACUTE ONLY): 31 min   Charges:   PT Evaluation $PT Eval Low Complexity: 1 Low PT Treatments $Gait Training: 8-22 mins   PT G Codes:        Roney Marion, PT  Acute Rehabilitation Services Pager [phone] Office [phone]   Colletta [place] 11/30/2017, 12:05 PM

## 2017-11-30 NOTE — Anesthesia Postprocedure Evaluation (Signed)
Anesthesia Post Note  Patient: Vilas G Martinique  Procedure(s) Performed: LEFT TOTAL HIP ARTHROPLASTY ANTERIOR APPROACH (Left Hip)     Patient location during evaluation: PACU Anesthesia Type: MAC Level of consciousness: oriented and awake and alert Pain management: pain level controlled Vital Signs Assessment: post-procedure vital signs reviewed and stable Respiratory status: spontaneous breathing, respiratory function stable and patient connected to nasal cannula oxygen Cardiovascular status: blood pressure returned to baseline and stable Postop Assessment: no headache, no backache and no apparent nausea or vomiting Anesthetic complications: no    Last Vitals:  Vitals:   11/29/17 2028 11/30/17 0400  BP: (!) 154/90 (!) 134/94  Pulse: (!) 51 68  Resp: 18 16  Temp: (!) 36.4 C 36.9 C  SpO2: 100% 94%    Last Pain:  Vitals:   11/30/17 0902  TempSrc:   PainSc: 6                  Adrien Dietzman

## 2017-11-30 NOTE — Progress Notes (Signed)
[name] to be D/C'd  per MD order. Discussed with the patient and all questions fully answered.  VSS, Skin clean, dry and intact without evidence of skin break down, no evidence of skin tears noted.  IV catheter discontinued intact. Site without signs and symptoms of complications. Dressing and pressure applied.  An After Visit Summary was printed and given to the patient. Patient received prescription.  D/c education completed with patient/family including follow up instructions, medication list, d/c activities limitations if indicated, with other d/c instructions as indicated by MD - patient able to verbalize understanding, all questions fully answered.   Patient instructed to return to ED, call 911, or call MD for any changes in condition.   Patient to be escorted via [place], and D/C home via private auto.

## 2017-11-30 NOTE — Care Management Note (Addendum)
Case Management Note  Patient Details  Name: Nathan [name] MRN: [id number] Date of Birth: Feb 26, 1961  Subjective/Objective:                    Action/Plan: [facility] will bring patient a walker in approx one hour . Bedside nurse [facility] aware.  Patient with VA benefit. Explained VA will have to approve walker. Asked Patient for his [place] PCP . Patient became upset and stated " VA approved my walker in October , 2018 I just forgot to take it home" , explained will still need VA approval. Patient again become upset and kept repeating " you owe me a walker !! Give it to me or not".  [place] awaiting call back. Assisted Director on [address] aware. Expected Discharge Date:  11/30/17               Expected Discharge Plan:  Home/Self Care  In-House Referral:     Discharge planning Services  CM Consult  Post Acute Care Choice:  Durable Medical Equipment Choice offered to:  Patient  DME Arranged:  [facility] rolling DME Agency:     HH Arranged:    [place] Agency:     Status of Service:  In process, will continue to follow  If discussed at Long Length of Stay Meetings, dates discussed:    Additional Comments:  Marilu Favre, RN 11/30/2017, 10:14 AM

## 2017-11-30 NOTE — Discharge Summary (Signed)
Physician Discharge Summary  Patient ID: Nathan [name] MRN: [id number] DOB/AGE: Oct 21, 1961 57 y.o.  Admit date: 11/29/2017 Discharge date: 11/30/2017  Admission Diagnoses:  Primary osteoarthritis of left hip  Discharge Diagnoses:  Principal Problem:   Primary osteoarthritis of left hip Active Problems:   Osteoarthritis of left hip   Past Medical History:  Diagnosis Date  . Arthritis    dengenerative joint surgery-  R hip & L hip  . Family history of adverse reaction to anesthesia    PAternal Margot Chimes- age 17's  "died due to too much anesthesia"  . History of kidney stones    laser surgery    Surgeries: Procedure(s): LEFT TOTAL HIP ARTHROPLASTY ANTERIOR APPROACH on 11/29/2017   Consultants (if any):   Discharged Condition: Improved  Hospital Course: Nathan [name] is an 57 y.o. male who was admitted 11/29/2017 with a diagnosis of Primary osteoarthritis of left hip and went to the operating room on 11/29/2017 and underwent the above named procedures.    He was given perioperative antibiotics:  Anti-infectives (From admission, onward)   Start     Dose/Rate Route Frequency Ordered Stop   11/29/17 2130  ceFAZolin (ANCEF) IVPB 1 g/50 mL premix     1 g 100 mL/hr over 30 Minutes Intravenous Every 6 hours 11/29/17 1739 11/30/17 0430   11/29/17 1315  ceFAZolin (ANCEF) IVPB 2g/100 mL premix     2 g 200 mL/hr over 30 Minutes Intravenous To [facility]Stay [facility] 11/26/17 1036 11/29/17 1431    .  He was given sequential compression devices, early ambulation, and ASA for DVT prophylaxis.  He benefited maximally from the hospital stay and there were no complications.    Recent vital signs:  Vitals:   11/29/17 2028 11/30/17 0400  BP: (!) 154/90 (!) 134/94  Pulse: (!) 51 68  Resp: 18 16  Temp: (!) 97.5 F (36.4 C) 98.4 F (36.9 C)  SpO2: 100% 94%    Recent laboratory studies:  Lab Results  Component Value Date   HGB 12.2 (L) 11/30/2017   HGB 15.3 11/18/2017   HGB 12.3 (L)  08/31/2017   Lab Results  Component Value Date   WBC 12.5 (H) 11/30/2017   PLT 166 11/30/2017   No results found for: INR Lab Results  Component Value Date   NA 137 11/30/2017   K 4.0 11/30/2017   CL 100 (L) 11/30/2017   CO2 29 11/30/2017   BUN 9 11/30/2017   CREATININE 0.75 11/30/2017   GLUCOSE 118 (H) 11/30/2017    Discharge Medications:   Allergies as of 11/30/2017   No Known Allergies     Medication List    TAKE these medications   aspirin 81 MG chewable tablet Chew 1 tablet (81 mg total) by mouth 2 (two) times daily.   docusate sodium 100 MG capsule Commonly known as:  COLACE Take 1 capsule (100 mg total) by mouth 2 (two) times daily.   HYDROcodone-acetaminophen 5-325 MG tablet Commonly known as:  NORCO/VICODIN Take 1-2 tablets by mouth every 6 (six) hours as needed (hip pain). What changed:  reasons to take this   naproxen sodium 220 MG tablet Commonly known as:  ALEVE Take 440 mg by mouth 2 (two) times daily as needed (for pain or headache).   ondansetron 4 MG tablet Commonly known as:  ZOFRAN Take 1 tablet (4 mg total) by mouth every 6 (six) hours as needed for nausea.   senna 8.6 MG Tabs tablet Commonly known as:  [employer]  Take 2 tablets (17.2 mg total) by mouth at bedtime.       Diagnostic Studies: Dg Pelvis Portable  Result Date: 11/29/2017 CLINICAL DATA:  Status post left hip replacement. EXAM: PORTABLE PELVIS 1-2 VIEWS COMPARISON:  08/30/2017 FINDINGS: New left hip replacement. Expected lucency within the left hip soft tissues. Stable appearance of the right hip replacement. Both hips appear located on this portable single view. Pelvic bony ring is intact. IMPRESSION: Recent left hip replacement.  No complicating features. Electronically Signed   By: Markus Daft M.D.   On: 11/29/2017 17:49   Dg C-arm 1-60 Min  Result Date: 11/29/2017 CLINICAL DATA:  Left total hip arthroplasty. EXAM: OPERATIVE LEFT HIP (WITH PELVIS IF PERFORMED) TECHNIQUE:  Fluoroscopic spot image(s) were submitted for interpretation post-operatively. COMPARISON:  Pelvic radiographs 08/30/2017 FINDINGS: Three intraoperative spot fluoroscopic images are provided in demonstrate interval performance of a left total hip arthroplasty. The prosthetic components appear normally located on these limited images. A pre-existing right hip arthroplasty is partially visualized. No acute fracture is identified. IMPRESSION: Intraoperative images during left total hip arthroplasty. Electronically Signed   By: Logan Bores M.D.   On: 11/29/2017 16:17   Dg Hip Operative Unilat With Pelvis Left  Result Date: 11/29/2017 CLINICAL DATA:  Left total hip arthroplasty. EXAM: OPERATIVE LEFT HIP (WITH PELVIS IF PERFORMED) TECHNIQUE: Fluoroscopic spot image(s) were submitted for interpretation post-operatively. COMPARISON:  Pelvic radiographs 08/30/2017 FINDINGS: Three intraoperative spot fluoroscopic images are provided in demonstrate interval performance of a left total hip arthroplasty. The prosthetic components appear normally located on these limited images. A pre-existing right hip arthroplasty is partially visualized. No acute fracture is identified. IMPRESSION: Intraoperative images during left total hip arthroplasty. Electronically Signed   By: Logan Bores M.D.   On: 11/29/2017 16:17    Disposition: 01-Home or Self Care  Discharge Instructions    Call MD / Call 911   Complete by:  As directed    If you experience chest pain or shortness of breath, CALL 911 and be transported to the hospital emergency room.  If you develope a fever above 101 F, pus (white drainage) or increased drainage or redness at the wound, or calf pain, call your surgeon's office.   Constipation Prevention   Complete by:  As directed    Drink plenty of fluids.  Prune juice may be helpful.  You may use a stool softener, such as Colace (over the counter) 100 mg twice a day.  Use MiraLax (over the counter) for  constipation as needed.   Diet - low sodium heart healthy   Complete by:  As directed    Driving restrictions   Complete by:  As directed    No driving for 6 weeks   Increase activity slowly as tolerated   Complete by:  As directed    Lifting restrictions   Complete by:  As directed    No lifting for 6 weeks   TED hose   Complete by:  As directed    Use stockings (TED hose) for 2 weeks on both leg(s).  You may remove them at night for sleeping.      Follow-up Information    Nijah Orlich, Aaron Edelman, MD. Schedule an appointment as soon as possible for a visit in 2 weeks.   Specialty:  Orthopedic Surgery Why:  For wound re-check Contact information: [address] [place] [address] [phone]            Signed: Hilton Cork Callaghan Laverdure 11/30/2017,  5:22 PM

## 2017-11-30 NOTE — Progress Notes (Signed)
   Subjective:  Patient reports pain as mild to moderate.  Denies N/V/CP/SOB.  Objective:   VITALS:   Vitals:   11/29/17 1930 11/29/17 1945 11/29/17 2028 11/30/17 0400  BP: (!) 126/95  (!) 154/90 (!) 134/94  Pulse: (!) 53 (!) 57 (!) 51 68  Resp: 17 15 18 16   Temp:  98.1 F (36.7 C) (!) 97.5 F (36.4 C) 98.4 F (36.9 C)  TempSrc:   Oral Oral  SpO2: 98% 98% 100% 94%  Weight:   73 kg (160 lb 15 oz)   Height:   5\' 10"  (1.778 m)    NAD ABD soft Sensation intact distally Intact pulses distally Dorsiflexion/Plantar flexion intact Incision: dressing C/D/I Compartment soft   Lab Results  Component Value Date   WBC 10.8 (H) 11/18/2017   HGB 15.3 11/18/2017   HCT 45.6 11/18/2017   MCV 95.8 11/18/2017   PLT 200 11/18/2017   BMET    Component Value Date/Time   NA 142 11/18/2017 1035   K 3.7 11/18/2017 1035   CL 102 11/18/2017 1035   CO2 31 11/18/2017 1035   GLUCOSE 96 11/18/2017 1035   BUN 9 11/18/2017 1035   CREATININE 0.83 11/18/2017 1035   CALCIUM 9.6 11/18/2017 1035   GFRNONAA >60 11/18/2017 1035   GFRAA >60 11/18/2017 1035     Assessment/Plan: 1 Day Post-Op   Principal Problem:   Primary osteoarthritis of left hip Active Problems:   Osteoarthritis of left hip   WBAT with walker ASA, SCDs, TEDs PO pain control PT/OT AM labs pending Dispo: D/C home today with HEP     Hilton Cork Adra Shepler 11/30/2017, 7:54 AM   Rod Can, MD Cell [phone]

## 2018-10-21 DIAGNOSIS — R030 Elevated blood-pressure reading, without diagnosis of hypertension: Secondary | ICD-10-CM | POA: Insufficient documentation

## 2018-11-11 ENCOUNTER — Telehealth: Payer: Self-pay | Admitting: *Deleted

## 2018-11-11 NOTE — Telephone Encounter (Signed)
Oncology Nurse Navigator Documentation  Oncology Nurse Navigator Flowsheets 11/11/2018  Navigator Location [facility]  Referral date to RadOnc/MedOnc 11/11/2018  Navigator Encounter Type Telephone/I received referral today on Mr. Martinique.  In going though his information, I was unclear of what treatment or work up he had.  [facility] and spoke [facility] nurse navigator to help with more records and faxing them to me.  I called patient to update him that I am waiting on more records.  He verbalized understanding that he will get a follow up call with an appt.   Telephone Outgoing Call  Treatment Phase Pre-Tx/Tx Discussion  Barriers/Navigation Needs Education;Coordination of Care  Education Other  Interventions Coordination of Care;Education  Coordination of Care Other  Education Method Verbal  Acuity Level 2  Time Spent with Patient 30

## 2018-11-14 ENCOUNTER — Encounter: Payer: Self-pay | Admitting: *Deleted

## 2018-11-14 NOTE — Progress Notes (Signed)
Oncology Nurse Navigator Documentation  Oncology Nurse Navigator Flowsheets 11/14/2018  Navigator Location [facility]  Navigator Encounter Type Other/I received more medical records from the [place].  I have not received the pathology report so I called them today.  I spoke with Pamala Hurry a nurse with [facility] and was told it would be fax to me.  I updated her on fax number.    Treatment Phase Pre-Tx/Tx Discussion  Barriers/Navigation Needs Coordination of Care  Interventions Coordination of Care  Coordination of Care Other  Acuity Level 1  Time Spent with Patient 30

## 2018-11-15 ENCOUNTER — Telehealth: Payer: Self-pay | Admitting: *Deleted

## 2018-11-15 ENCOUNTER — Encounter: Payer: Self-pay | Admitting: *Deleted

## 2018-11-15 DIAGNOSIS — R918 Other nonspecific abnormal finding of lung field: Secondary | ICD-10-CM

## 2018-11-15 NOTE — Telephone Encounter (Signed)
Oncology Nurse Navigator Documentation  Oncology Nurse Navigator Flowsheets 11/15/2018  Navigator Location [facility]  Navigator Encounter Type Telephone/I received more records from [place] including the pathology.  I called Nathan [name] and scheduled him to be seen on 11/30/18. He verbalized understanding of appt time and place.   Telephone Outgoing Call  Treatment Phase Pre-Tx/Tx Discussion  Barriers/Navigation Needs Education;Coordination of Care  Education Other  Interventions Coordination of Care;Education  Coordination of Care Appts  Education Method Verbal  Acuity Level 1  Time Spent with Patient 30

## 2018-11-30 ENCOUNTER — Encounter: Payer: Self-pay | Admitting: Internal Medicine

## 2018-11-30 ENCOUNTER — Encounter: Payer: Self-pay | Admitting: *Deleted

## 2018-11-30 ENCOUNTER — Inpatient Hospital Stay (HOSPITAL_BASED_OUTPATIENT_CLINIC_OR_DEPARTMENT_OTHER): Payer: No Typology Code available for payment source | Admitting: Internal Medicine

## 2018-11-30 ENCOUNTER — Other Ambulatory Visit (HOSPITAL_COMMUNITY): Payer: Self-pay | Admitting: Internal Medicine

## 2018-11-30 ENCOUNTER — Inpatient Hospital Stay
Admission: RE | Admit: 2018-11-30 | Discharge: 2018-11-30 | Disposition: A | Discharge status: Home / Self Care | Payer: Self-pay | Source: Ambulatory Visit | Attending: Internal Medicine | Admitting: Internal Medicine

## 2018-11-30 ENCOUNTER — Inpatient Hospital Stay: Payer: No Typology Code available for payment source | Attending: Internal Medicine

## 2018-11-30 DIAGNOSIS — Z87442 Personal history of urinary calculi: Secondary | ICD-10-CM | POA: Diagnosis not present

## 2018-11-30 DIAGNOSIS — Z720 Tobacco use: Secondary | ICD-10-CM

## 2018-11-30 DIAGNOSIS — Z801 Family history of malignant neoplasm of trachea, bronchus and lung: Secondary | ICD-10-CM | POA: Diagnosis not present

## 2018-11-30 DIAGNOSIS — R918 Other nonspecific abnormal finding of lung field: Secondary | ICD-10-CM

## 2018-11-30 DIAGNOSIS — C3431 Malignant neoplasm of lower lobe, right bronchus or lung: Secondary | ICD-10-CM

## 2018-11-30 DIAGNOSIS — Z96643 Presence of artificial hip joint, bilateral: Secondary | ICD-10-CM | POA: Insufficient documentation

## 2018-11-30 DIAGNOSIS — Z5111 Encounter for antineoplastic chemotherapy: Secondary | ICD-10-CM

## 2018-11-30 DIAGNOSIS — F17210 Nicotine dependence, cigarettes, uncomplicated: Secondary | ICD-10-CM | POA: Diagnosis not present

## 2018-11-30 DIAGNOSIS — C801 Malignant (primary) neoplasm, unspecified: Secondary | ICD-10-CM

## 2018-11-30 DIAGNOSIS — Z7189 Other specified counseling: Secondary | ICD-10-CM

## 2018-11-30 DIAGNOSIS — C3491 Malignant neoplasm of unspecified part of right bronchus or lung: Secondary | ICD-10-CM | POA: Insufficient documentation

## 2018-11-30 LAB — CBC WITH DIFFERENTIAL (CANCER CENTER ONLY)
Abs Immature Granulocytes: 0.07 10*3/uL (ref 0.00–0.07)
BASOS PCT: 1 %
Basophils Absolute: 0.1 10*3/uL (ref 0.0–0.1)
EOS ABS: 0.3 10*3/uL (ref 0.0–0.5)
Eosinophils Relative: 3 %
HEMATOCRIT: 44.4 % (ref 39.0–52.0)
Hemoglobin: 14.7 g/dL (ref 13.0–17.0)
IMMATURE GRANULOCYTES: 1 %
LYMPHS ABS: 2.2 10*3/uL (ref 0.7–4.0)
Lymphocytes Relative: 20 %
MCH: 31.2 pg (ref 26.0–34.0)
MCHC: 33.1 g/dL (ref 30.0–36.0)
MCV: 94.3 fL (ref 80.0–100.0)
MONO ABS: 0.9 10*3/uL (ref 0.1–1.0)
Monocytes Relative: 8 %
Neutro Abs: 7.3 10*3/uL (ref 1.7–7.7)
Neutrophils Relative %: 67 %
PLATELETS: 182 10*3/uL (ref 150–400)
RBC: 4.71 MIL/uL (ref 4.22–5.81)
RDW: 11.9 % (ref 11.5–15.5)
WBC Count: 10.8 10*3/uL — ABNORMAL HIGH (ref 4.0–10.5)
nRBC: 0 % (ref 0.0–0.2)

## 2018-11-30 LAB — CMP (CANCER CENTER ONLY)
ALT: 70 U/L — ABNORMAL HIGH (ref 0–44)
AST: 28 U/L (ref 15–41)
Albumin: 3.9 g/dL (ref 3.5–5.0)
Alkaline Phosphatase: 103 U/L (ref 38–126)
Anion gap: 8 (ref 5–15)
BUN: 13 mg/dL (ref 6–20)
CHLORIDE: 102 mmol/L (ref 98–111)
CO2: 31 mmol/L (ref 22–32)
Calcium: 9.3 mg/dL (ref 8.9–10.3)
Creatinine: 0.92 mg/dL (ref 0.61–1.24)
Glucose, Bld: 112 mg/dL — ABNORMAL HIGH (ref 70–99)
POTASSIUM: 4.3 mmol/L (ref 3.5–5.1)
SODIUM: 141 mmol/L (ref 135–145)
Total Bilirubin: 0.6 mg/dL (ref 0.3–1.2)
Total Protein: 7.3 g/dL (ref 6.5–8.1)

## 2018-11-30 NOTE — Progress Notes (Signed)
Oncology Nurse Navigator Documentation  Oncology Nurse Navigator Flowsheets 11/30/2018  [facility]igator Location [facility]  Navigator Encounter Type Clinic/MDC/Met Nathan [name] today at clinic.  He has stage III lung cancer and treatment plan is concurrent chemo rad.  I took his CD of scans to radiology dept to get loaded in PACS.  I will update Rad Onc scheduling team of referral.   Patient Visit Type MedOnc  Treatment Phase Pre-Tx/Tx Discussion  Barriers/Navigation Needs Coordination of Care  Interventions Coordination of Care  Coordination of Care Other  Acuity Level 2  Time Spent with Patient 30

## 2018-11-30 NOTE — Progress Notes (Signed)
[facility] [phone]   [phone]  CONSULT NOTE  REFERRING PHYSICIAN: Dr. Hilda Blades  REASON FOR CONSULTATION:  58 years old African-American male recently diagnosed with lung cancer.  HPI Nathan [name] is a 58 y.o. male with long history of smoking and past medical history significant for osteoarthritis, kidney stones as well as bilateral hip replacement.  The patient was seen by his primary care physician at the [facility] facility in [facility].  During his evaluation and because his long history of smoking he underwent CT screening of the chest on Mar 24, 2018 and that showed suspicious lesion in the right lower lobe of the lung.  This was followed by CT scan of the chest on May 27, 2018 at [facility] and it showed right perihilar mass lesion measuring 3.0 x 2.3 x 2.1 cm centered within the right lower lobe which appears to cross the major fissure.  The mass encroaches upon the right lower lobe bronchus without significant narrowing and encases to right lower lobe segmental pulmonary arteries without evidence of thrombus.  There was also multiple prominent axillary and mediastinal lymph nodes without pathologic enlargement including a prominent subcarinal lymph node tissue within station 7 and 11 on the right.  On June 13, 2018 the patient underwent bronchoscopy with endobronchial ultrasound and biopsies of the right lower lobe lung mass and mediastinal lymphadenopathy but the pathology was not conclusive for malignancy.  A PET scan was performed on October 14, 2018 and that showed interval increase in the size of FDG avid right perihilar mass within the right lower lobe consistent with primary lung cancer.  There was no evidence of metastatic disease.  The prominent subcarinal and right hilar lymph nodes tissue had no evidence of FDG uptake.  The patient was referred to Dr. Lianne Moris for consideration of surgical resection.  On November 01, 2018 he underwent flexible fiberoptic bronchoscopy, right robotic assisted biopsy of central right lung mass and mediastinal lymph node dissection with resection of lymph nodes at stations 4, 7 and 11 with a chest tube placement.  During the procedure the the central portion of the right lung mass was found to involve the hilum and the main pulmonary artery as well as the right upper and right middle and right lower lobes.  It was determined that during the surgery that the patient is not surgically resectable without performing an anatomic right pneumonectomy.  Biopsies were performed from the lung mass in addition to mediastinal lymph node dissection with resection of lymph nodes at stations 4, 7 and 11. The final pathology ([id number]) from [facility] showed the biopsy of the right lower lobe lung mass was consistent with invasive poorly differentiated carcinoma.  The dissected lymph node from level 4R also showed metastatic poorly differentiated carcinoma.  The immunohistochemical staining of the tumor cells were positive for cytokeratin AE1/AE3 and cytokeratin 7 but negative for cytokeratin 20, P 40, TTF-1, Napsin-A, p63, CDX2, PSA, PSAP, GATA 3, synaptophysin and chromogranin.  Molecular studies showed the tumor cells were negative for EGFR, ALK, ROS 1 and PDL 1 expression was 30%. The patient was told that he will need neoadjuvant treatment before reconsideration of surgical resection.  He was referred to me today for evaluation and discussion of this option.  When seen today he is feeling fine with no concerning complaints except for upset stomach and mild cough but he denied having any chest pain, shortness of breath or  hemoptysis.  He has no nausea, vomiting, diarrhea or constipation.  He denied having any recent weight loss or night sweats.  He has no headache or visual changes. Family history significant for father with lung cancer and mother had uterine cancer. The patient is single  and has 2 children.  He lives in [place].  He has a history for smoking for around 30 years and quit 6 months ago.  He drinks a pint of liquor every week and he also smokes marijuana.  HPI  Past Medical History:  Diagnosis Date  . Arthritis    dengenerative joint surgery-  R hip & L hip  . Family history of adverse reaction to anesthesia    PAternal Margot Chimes- age 6's  "died due to too much anesthesia"  . History of kidney stones    laser surgery    Past Surgical History:  Procedure Laterality Date  . HAND SURGERY Right 2008   thumb with pin  . LITHOTRIPSY    . TOTAL HIP ARTHROPLASTY Right 08/30/2017   Procedure: TOTAL HIP ARTHROPLASTY ANTERIOR APPROACH;  Surgeon: Rod Can, MD;  Location: [facility];  Service: Orthopedics;  Laterality: Right;  . TOTAL HIP ARTHROPLASTY Left 11/29/2017   Procedure: LEFT TOTAL HIP ARTHROPLASTY ANTERIOR APPROACH;  Surgeon: Rod Can, MD;  Location: [facility];  Service: Orthopedics;  Laterality: Left;  Needs RNFA    Family History  Problem Relation Age of Onset  . Cancer - Colon Mother 69  . Anemia Father 90    Social History Social History   Tobacco Use  . Smoking status: Light Tobacco Smoker    Years: 20.00  . Smokeless tobacco: Never Used  . Tobacco comment: "I may smoke 1cigs a day or not."  Substance Use Topics  . Alcohol use: Yes    Alcohol/week: 12.0 standard drinks    Types: 12 Cans of beer per week    Comment: 1 pint a week of Liquor  . Drug use: Yes    Frequency: 14.0 times per week    Types: Marijuana    Comment: 2 joints per day- last time this am    No Known Allergies  No current outpatient medications on file.   No current facility-administered medications for this visit.     Review of Systems  Constitutional: positive for fatigue Eyes: negative Ears, nose, mouth, throat, and face: negative Respiratory: positive for cough Cardiovascular: negative Gastrointestinal:  negative Genitourinary:negative Integument/breast: negative Hematologic/lymphatic: negative Musculoskeletal:negative Neurological: negative Behavioral/Psych: negative Endocrine: negative Allergic/Immunologic: negative  Physical Exam  HYW:VPXTG, healthy, no distress, well nourished, well developed and anxious SKIN: skin color, texture, turgor are normal, no rashes or significant lesions HEAD: Normocephalic, No masses, lesions, tenderness or abnormalities EYES: normal, PERRLA, Conjunctiva are pink and non-injected EARS: External ears normal, Canals clear OROPHARYNX:no exudate, no erythema and lips, buccal mucosa, and tongue normal  NECK: supple, no adenopathy, no JVD LYMPH:  no palpable lymphadenopathy, no hepatosplenomegaly LUNGS: clear to auscultation , and palpation HEART: regular rate & rhythm, no murmurs and no gallops ABDOMEN:abdomen soft, non-tender, normal bowel sounds and no masses or organomegaly BACK: Back symmetric, no curvature., No CVA tenderness EXTREMITIES:no joint deformities, effusion, or inflammation, no edema  NEURO: alert & oriented x 3 with fluent speech, no focal motor/sensory deficits  PERFORMANCE STATUS: ECOG 0  LABORATORY DATA: Lab Results  Component Value Date   WBC 10.8 (H) 11/30/2018   HGB 14.7 11/30/2018   HCT 44.4 11/30/2018   MCV 94.3 11/30/2018   PLT  182 11/30/2018      Chemistry      Component Value Date/Time   NA 141 11/30/2018 1224   K 4.3 11/30/2018 1224   CL 102 11/30/2018 1224   CO2 31 11/30/2018 1224   BUN 13 11/30/2018 1224   CREATININE 0.92 11/30/2018 1224      Component Value Date/Time   CALCIUM 9.3 11/30/2018 1224   ALKPHOS 103 11/30/2018 1224   AST 28 11/30/2018 1224   ALT 70 (H) 11/30/2018 1224   BILITOT 0.6 11/30/2018 1224       RADIOGRAPHIC STUDIES: No results found.  ASSESSMENT: This is a 58 years old African-American male recently diagnosed with stage IIIA (T2 a, N2, M0) non-small cell lung cancer presented  with central right lower lobe lung mass that was found during surgical exploration to involve the right hilum, main pulmonary artery, right upper, right middle and right lower lobes consistent with poorly differentiated carcinoma with mediastinal lymph node involvement in a station 4R. The patient was found during his surgical exploration not to be a good surgical candidate for resection unless he would have radical right pneumonectomy. He was referred today for evaluation and recommendation regarding other treatment options.   PLAN: I had a lengthy discussion with the patient today about his current disease stage, prognosis and treatment options. I personally and independently reviewed the imaging studies that were available with the patient today. I had a lengthy discussion with the patient about his treatment options at this point.  The patient presented to the clinic today with the idea that he will receive few cycles of neoadjuvant systemic chemotherapy that would shrink his tumor significantly to the point that he would undergo surgical resection again. I explained to the patient that neoadjuvant systemic chemotherapy does not guarantee that his tumor would decrease to the point that he would be surgically resectable and there is a high possibility that he may not be a surgical candidate for resection even with the new adjuvant systemic chemotherapy.  The patient was very upset and he would like to discuss it more with his Psychologist, sport and exercise at [facility]. I also gave the patient the option of proceeding with a short course of neoadjuvant concurrent chemoradiation for 5 weeks with weekly carboplatin for AUC of 2 and paclitaxel 45 mg/M2 followed by imaging studies and reevaluation for surgical resection.  If the patient is not a surgical candidate at that point, we would complete the course of concurrent chemoradiation for a total of 7 weeks. The patient was not happy with this recommendation and he  was under the impression that he would receive neoadjuvant systemic chemotherapy to shrink the tumor and this will be automatically followed by surgical resection.  I explained to the patient that I will be happy to give him the neoadjuvant therapy but I cannot guarantee that he would be surgical candidate after this treatment.  He has an appointment with Dr. Lianne Moris at [facility] in few days for more discussion of his treatment options. The patient had a lot of questions and I spent more than 90 minutes with him during the visit today.  I explained to the patient that I will be happy to give him whatever treatment is needed locally in [place] but there would be no guarantee for surgical resection in the future which need to be discussed with Dr. Lianne Moris. He will call the office after his meeting with the thoracic surgeon at [facility] if he needs to receive his treatment locally  in [place] but he may also choose to proceed with treatment at [facility]. He was advised to call immediately if he has any concerning symptoms in the interval. The patient voices understanding of current disease status and treatment options and is in agreement with the current care plan.  All questions were answered. The patient knows to call the clinic with any problems, questions or concerns. We can certainly see the patient much sooner if necessary.  Thank you so much for allowing me to participate in the care of Nathan [name]. I will continue to follow up the patient with you and assist in his care.  I spent 70 minutes counseling the patient face to face. The total time spent in the appointment was 90 minutes.  Disclaimer: This note was dictated with voice recognition software. Similar sounding words can inadvertently be transcribed and may not be corrected upon review.   Eilleen Kempf November 30, 2018, 1:08 PM

## 2018-12-02 ENCOUNTER — Telehealth: Payer: Self-pay | Admitting: *Deleted

## 2018-12-02 NOTE — Telephone Encounter (Signed)
Received call from patient regarding chemoradiation. He saw his MD @ [facility] this week and the MD told patient that chemoradiation was a good plan. Pt is agreeable and wants to go ahead and get those appts for Radiation Oncology and chemo set up.

## 2018-12-09 ENCOUNTER — Telehealth: Payer: Self-pay | Admitting: Medical Oncology

## 2018-12-09 NOTE — Telephone Encounter (Signed)
Spoke to pt and he said he wants to proceed with tx here. ( he saw Dr Lianne Moris).

## 2018-12-10 NOTE — Telephone Encounter (Signed)
Please schedule him for another follow-up appointment first available.  Did he mention to you what kind of treatment he is planning to take.  Is it just chemo or chemo and radiation?  He will need referral to radiation oncology if he is accepting a course of concurrent chemoradiation.

## 2018-12-12 ENCOUNTER — Telehealth: Payer: Self-pay | Admitting: *Deleted

## 2018-12-12 DIAGNOSIS — C3491 Malignant neoplasm of unspecified part of right bronchus or lung: Secondary | ICD-10-CM

## 2018-12-12 NOTE — Telephone Encounter (Signed)
Oncology Nurse Navigator Documentation  Oncology Nurse Navigator Flowsheets 12/12/2018  Navigator Location [facility]  Navigator Encounter Type Telephone/I received a message that patient called and wants the appt for tomorrow. I called to clarify.  Patient will be seen tomorrow and is aware of appt.   Telephone Incoming Call;Outgoing Call  Treatment Phase Pre-Tx/Tx Discussion  Barriers/Navigation Needs Coordination of Care  Interventions Coordination of Care  Coordination of Care Appts  Acuity Level 1  Time Spent with Patient 15

## 2018-12-12 NOTE — Telephone Encounter (Signed)
Oncology Nurse Navigator Documentation  Oncology Nurse Navigator Flowsheets 12/12/2018  Navigator Location [facility]  Navigator Encounter Type Telephone/I called Nathan [name] to schedule him for a follow up with Dr. Julien Nordmann. I gave him 3 different appt dates and times. He is unable to make any of these appts.  I updated him I will have scheduling call him with an other appt.  Scheduling message completed.   Telephone Outgoing Call  Treatment Phase Pre-Tx/Tx Discussion  Barriers/Navigation Needs Education;Coordination of Care  Education Other  Interventions Coordination of Care;Education  Coordination of Care Other  Acuity Level 2  Time Spent with Patient 30

## 2018-12-13 ENCOUNTER — Encounter: Payer: Self-pay | Admitting: Internal Medicine

## 2018-12-13 ENCOUNTER — Inpatient Hospital Stay: Payer: No Typology Code available for payment source

## 2018-12-13 ENCOUNTER — Inpatient Hospital Stay (HOSPITAL_BASED_OUTPATIENT_CLINIC_OR_DEPARTMENT_OTHER): Payer: No Typology Code available for payment source | Admitting: Internal Medicine

## 2018-12-13 VITALS — BP 131/71 | HR 70 | Temp 98.2°F | Resp 18 | Ht 71.0 in | Wt 164.3 lb

## 2018-12-13 DIAGNOSIS — Z87448 Personal history of other diseases of urinary system: Secondary | ICD-10-CM

## 2018-12-13 DIAGNOSIS — Z7189 Other specified counseling: Secondary | ICD-10-CM

## 2018-12-13 DIAGNOSIS — C3491 Malignant neoplasm of unspecified part of right bronchus or lung: Secondary | ICD-10-CM | POA: Diagnosis not present

## 2018-12-13 DIAGNOSIS — C3431 Malignant neoplasm of lower lobe, right bronchus or lung: Secondary | ICD-10-CM | POA: Diagnosis not present

## 2018-12-13 DIAGNOSIS — Z5111 Encounter for antineoplastic chemotherapy: Secondary | ICD-10-CM

## 2018-12-13 DIAGNOSIS — Z96643 Presence of artificial hip joint, bilateral: Secondary | ICD-10-CM | POA: Diagnosis not present

## 2018-12-13 DIAGNOSIS — F17210 Nicotine dependence, cigarettes, uncomplicated: Secondary | ICD-10-CM | POA: Diagnosis not present

## 2018-12-13 LAB — CMP (CANCER CENTER ONLY)
ALK PHOS: 102 U/L (ref 38–126)
ALT: 33 U/L (ref 0–44)
AST: 17 U/L (ref 15–41)
Albumin: 3.9 g/dL (ref 3.5–5.0)
Anion gap: 11 (ref 5–15)
BILIRUBIN TOTAL: 0.6 mg/dL (ref 0.3–1.2)
BUN: 15 mg/dL (ref 6–20)
CALCIUM: 9 mg/dL (ref 8.9–10.3)
CHLORIDE: 105 mmol/L (ref 98–111)
CO2: 23 mmol/L (ref 22–32)
CREATININE: 0.8 mg/dL (ref 0.61–1.24)
Glucose, Bld: 107 mg/dL — ABNORMAL HIGH (ref 70–99)
Potassium: 3.9 mmol/L (ref 3.5–5.1)
Sodium: 139 mmol/L (ref 135–145)
TOTAL PROTEIN: 7.1 g/dL (ref 6.5–8.1)

## 2018-12-13 LAB — CBC WITH DIFFERENTIAL (CANCER CENTER ONLY)
Abs Immature Granulocytes: 0.05 10*3/uL (ref 0.00–0.07)
BASOS ABS: 0.1 10*3/uL (ref 0.0–0.1)
BASOS PCT: 1 %
EOS ABS: 0.3 10*3/uL (ref 0.0–0.5)
EOS PCT: 3 %
HEMATOCRIT: 42.8 % (ref 39.0–52.0)
Hemoglobin: 14.4 g/dL (ref 13.0–17.0)
IMMATURE GRANULOCYTES: 1 %
LYMPHS ABS: 2.8 10*3/uL (ref 0.7–4.0)
Lymphocytes Relative: 27 %
MCH: 31.2 pg (ref 26.0–34.0)
MCHC: 33.6 g/dL (ref 30.0–36.0)
MCV: 92.6 fL (ref 80.0–100.0)
MONOS PCT: 9 %
Monocytes Absolute: 0.9 10*3/uL (ref 0.1–1.0)
NEUTROS PCT: 59 %
NRBC: 0 % (ref 0.0–0.2)
Neutro Abs: 6.2 10*3/uL (ref 1.7–7.7)
PLATELETS: 192 10*3/uL (ref 150–400)
RBC: 4.62 MIL/uL (ref 4.22–5.81)
RDW: 12.1 % (ref 11.5–15.5)
WBC: 10.4 10*3/uL (ref 4.0–10.5)

## 2018-12-13 MED ORDER — PROCHLORPERAZINE MALEATE 10 MG PO TABS: 10.0000 mg | ORAL_TABLET | Freq: Four times a day (QID) | ORAL | 0 refills | Status: DC | PRN

## 2018-12-13 NOTE — Progress Notes (Signed)
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  PRIOR THERAPY: Status post surgical exploration and the patient was found not to be a good surgical candidate for resection unless he would have radical right pneumonectomy under the care of Dr. Lianne Moris at [facility].  CURRENT THERAPY: Concurrent chemoradiation with weekly carboplatin for AUC of 2 and paclitaxel 45 mg/M2.  First dose December 26, 2018.  INTERVAL HISTORY: Nathan [name] 58 y.o. [name] returns to the clinic today for follow-up visit.  The patient is feeling fine today with no concerning complaints.  He denied having any current chest pain, shortness of breath, cough or hemoptysis.  He denied having any nausea, vomiting, diarrhea or constipation.  He denied having any headache or visual changes.  He was seen recently by thoracic surgery at [facility] for reevaluation and he was advised to proceed with the course of concurrent chemoradiation as recommended here in [place].  The patient is here today for reevaluation and discussion of this treatment option.  MEDICAL HISTORY: Past Medical History:  Diagnosis Date  . Arthritis    dengenerative joint surgery-  R hip & L hip  . Family history of adverse reaction to anesthesia    PAternal Margot Chimes- age 36's  "died due to too much anesthesia"  . History of kidney stones    laser surgery    ALLERGIES:  has No Known Allergies.  MEDICATIONS:  No current outpatient medications on file.    No current facility-administered medications for this visit.     SURGICAL HISTORY:  Past Surgical History:  Procedure Laterality Date  . HAND SURGERY Right 2008   thumb with pin  . LITHOTRIPSY    . TOTAL HIP ARTHROPLASTY Right 08/30/2017   Procedure: TOTAL HIP ARTHROPLASTY ANTERIOR APPROACH;  Surgeon: Rod Can, MD;  Location: [place];  Service: Orthopedics;  Laterality: Right;  . TOTAL HIP ARTHROPLASTY Left 11/29/2017   Procedure: LEFT TOTAL HIP ARTHROPLASTY ANTERIOR APPROACH;  Surgeon: Rod Can, MD;  Location: [place];  Service: Orthopedics;  Laterality: Left;  Needs RNFA    REVIEW OF SYSTEMS:  Constitutional: negative Eyes: negative Ears, nose, mouth, throat, and face: negative Respiratory: negative Cardiovascular: negative Gastrointestinal: negative Genitourinary:negative Integument/breast: negative Hematologic/lymphatic: negative Musculoskeletal:negative Neurological: negative Behavioral/Psych: negative Endocrine: negative Allergic/Immunologic: negative   PHYSICAL EXAMINATION: General appearance: alert, cooperative and no distress Head: Normocephalic, without obvious abnormality, atraumatic Neck: no adenopathy, no JVD, supple, symmetrical, trachea midline and thyroid not enlarged, symmetric, no tenderness/mass/nodules Lymph nodes: Cervical, supraclavicular, and axillary nodes normal. Resp: clear to auscultation bilaterally Back: symmetric, no curvature. ROM normal. No CVA tenderness. Cardio: regular rate and rhythm, S1, S2 normal, no murmur, click, rub or gallop GI: soft, non-tender; bowel sounds normal; no masses,  no organomegaly Extremities: extremities normal, atraumatic, no cyanosis or edema Neurologic: Alert and oriented X 3, normal strength and tone. Normal symmetric reflexes. Normal coordination and gait  ECOG PERFORMANCE STATUS: 1 - Symptomatic but completely ambulatory  Blood pressure 131/71, pulse 70, temperature 98.2 F (36.8 C), temperature  source Oral, resp. rate 18, height 5\' 11"  (1.803 m), weight 164 lb 4.8 oz (74.5 kg), SpO2 95 %.  LABORATORY DATA: Lab Results  Component Value Date   WBC 10.4 12/13/2018   HGB 14.4 12/13/2018   HCT 42.8 12/13/2018   MCV 92.6 12/13/2018   PLT 192 12/13/2018      Chemistry      Component Value Date/Time   NA 139 12/13/2018 1514   K 3.9 12/13/2018 1514   CL 105 12/13/2018 1514   CO2 23 12/13/2018 1514   BUN 15 12/13/2018 1514   CREATININE 0.80 12/13/2018 1514      Component Value Date/Time   CALCIUM 9.0 12/13/2018 1514   ALKPHOS 102 12/13/2018 1514   AST 17 12/13/2018 1514   ALT 33 12/13/2018 1514   BILITOT 0.6 12/13/2018 1514       RADIOGRAPHIC STUDIES: No results found.  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 I had a lengthy discussion with the patient today about his condition and treatment options.  I discussed with the patient the option of course of concurrent chemoradiation with weekly carboplatin for AUC of 2 and paclitaxel 45 mg/M2 initially for 5 weeks then reevaluation for surgery and if the patient is not a good surgical candidate for resection, he may continue 2 more weeks of concurrent chemoradiation followed by consolidation immunotherapy. I discussed with the patient the adverse effect of the chemotherapy including but not limited to alopecia, myelosuppression, nausea and vomiting, peripheral neuropathy, liver or renal dysfunction. I will refer the patient to radiation oncology for evaluation and discussion of the radiotherapy option. He is expected to start the first cycle of this treatment on December 26, 2018. I will arrange for the patient to have a chemotherapy education class before the first dose of his treatment. I will call his pharmacy with prescription for Compazine 10 mg p.o. every 6 hours as needed for nausea. I will see the patient back for follow-up visit 1 week after the start of his treatment for reevaluation and management of any adverse effect of his treatment. He was advised to call immediately if he has any concerning symptoms in the interval. The patient voices understanding of current disease status and treatment options and is in agreement with the current care plan.  All questions were answered. The patient knows to call the clinic with any problems, questions or concerns. We can certainly see the patient much sooner if necessary.  I spent 15 minutes counseling the patient face to face. The total time spent in the appointment was 25 minutes.  Disclaimer: This note was dictated with voice recognition software. Similar sounding words can inadvertently be transcribed and may not be corrected upon review.

## 2018-12-13 NOTE — Progress Notes (Signed)
START ON PATHWAY REGIMEN - Non-Small Cell Lung     Administer weekly:     Paclitaxel      Carboplatin   **Always confirm dose/schedule in your pharmacy ordering system**  Patient Characteristics: Stage III - Unresectable, PS = 0, 1 AJCC T Category: T2a Current Disease Status: No Distant Mets or Local Recurrence AJCC N Category: N2 AJCC M Category: M0 AJCC 8 Stage Grouping: IIIA Performance Status: PS = 0, 1 Intent of Therapy: Curative Intent, Discussed with Patient

## 2018-12-14 ENCOUNTER — Telehealth: Payer: Self-pay | Admitting: Medical Oncology

## 2018-12-14 NOTE — Telephone Encounter (Addendum)
Pt needs auth from [place] to have xrt consult and treatment at [facility]. Message sent to [facility].

## 2018-12-21 ENCOUNTER — Inpatient Hospital Stay: Payer: No Typology Code available for payment source

## 2018-12-22 ENCOUNTER — Other Ambulatory Visit: Payer: Self-pay | Admitting: Radiation Oncology

## 2018-12-22 ENCOUNTER — Ambulatory Visit
Admission: RE | Admit: 2018-12-22 | Discharge: 2018-12-22 | Disposition: A | Discharge status: Home / Self Care | Payer: Self-pay | Source: Ambulatory Visit | Attending: Radiation Oncology | Admitting: Radiation Oncology

## 2018-12-22 DIAGNOSIS — C3490 Malignant neoplasm of unspecified part of unspecified bronchus or lung: Secondary | ICD-10-CM

## 2018-12-23 ENCOUNTER — Other Ambulatory Visit: Payer: Self-pay | Admitting: *Deleted

## 2018-12-23 DIAGNOSIS — C3491 Malignant neoplasm of unspecified part of right bronchus or lung: Secondary | ICD-10-CM

## 2018-12-23 DIAGNOSIS — Z5111 Encounter for antineoplastic chemotherapy: Secondary | ICD-10-CM

## 2018-12-23 NOTE — Progress Notes (Signed)
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  Biopsies revealed: The final pathology ([id number]) from [facility] showed the biopsy of the right lower lobe lung mass was consistent with invasive poorly differentiated carcinoma.  The dissected lymph node from level 4R also showed metastatic poorly differentiated carcinoma.  The immunohistochemical staining of the tumor cells were positive for cytokeratin AE1/AE3 and cytokeratin 7 but negative for cytokeratin 20, P 40, TTF-1, Napsin-A, p63, CDX2, PSA, PSAP, GATA 3, synaptophysin and chromogranin.  Molecular studies showed the tumor cells were negative for EGFR, ALK, ROS 1 and PDL 1 expression was 30%.  Tobacco/Marijuana/Snuff/ETOH use:  Tobacco Use  . Smoking status: Light Tobacco Smoker    Years: 20.00  . Smokeless tobacco: Never Used  . Tobacco comment: "I may smoke 1cigs a day or not."  Substance Use Topics  . Alcohol use: Yes    Alcohol/week: 12.0 standard drinks    Types: 12 Cans of beer per week    Comment: 1 pint a week of Liquor  . Drug use: Yes    Frequency: 14.0 times per week    Types: Marijuana    Comment: 2 joints per day- last time this am     Past/Anticipated interventions by cardiothoracic surgery, if any: the patient was found not to be a good surgical candidate for resection unless he would have radical right pneumonectomy under the care of Dr. Lianne [name] at [facility].  Past/Anticipated interventions by medical oncology, if any: 12/13/18 Per Dr. 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 I had a lengthy discussion with the patient today about his condition and treatment options.  I discussed with the patient the option of course of concurrent chemoradiation with weekly carboplatin for AUC of 2 and paclitaxel 45 mg/M2 initially for 5 weeks then reevaluation for surgery and if the patient is not a good surgical candidate for resection, he may continue 2 more weeks of concurrent chemoradiation followed by consolidation immunotherapy. I discussed with the patient the adverse effect of the chemotherapy including but not limited to alopecia, myelosuppression, nausea and vomiting, peripheral neuropathy, liver or renal dysfunction. I will refer the patient to radiation oncology for evaluation and discussion of the radiotherapy option. He is expected to start the first cycle of this treatment on December 26, 2018. I will arrange for the patient to have a chemotherapy education class before the first dose of his treatment. I will call his pharmacy with prescription for Compazine 10 mg p.o. every 6 hours as needed for nausea. I will see the patient back for follow-up visit 1 week after the start of his treatment for reevaluation and management of any adverse effect of his treatment.  Signs/Symptoms  Weight changes, if any: He denied having any recent weight loss or night sweats.  Respiratory complaints,  if any:  denied having any current chest pain, shortness of breath, cough or hemoptysis  Hemoptysis, if any:  No  Pain issues, if any:  Pt denies c/o pain.  SAFETY ISSUES:  Prior radiation? No  Pacemaker/ICD? No   Possible current  pregnancy? N/A  Is the patient on methotrexate? No  Current Complaints / other details:  Pt presents today for initial consult with Dr. Sondra [name] for Radiation Oncology. Pt is unaccompanied.   BP 137/82   Pulse 62   Temp 98.2 F (36.8 C)   Resp 17   SpO2 98%   Wt Readings from Last 3 Encounters:  12/26/18 165 lb 8 oz (75.1 kg)  12/13/18 164 lb 4.8 oz (74.5 kg)  11/30/18 164 lb 3.2 oz (74.5 kg)   Nathan Sousa, RN BSN

## 2018-12-26 ENCOUNTER — Ambulatory Visit
Admission: RE | Admit: 2018-12-26 | Discharge: 2018-12-26 | Disposition: A | Discharge status: Home / Self Care | Payer: No Typology Code available for payment source | Source: Ambulatory Visit | Attending: Radiation Oncology | Admitting: Radiation Oncology

## 2018-12-26 ENCOUNTER — Other Ambulatory Visit: Payer: Self-pay

## 2018-12-26 ENCOUNTER — Inpatient Hospital Stay: Payer: No Typology Code available for payment source

## 2018-12-26 ENCOUNTER — Inpatient Hospital Stay: Payer: No Typology Code available for payment source | Attending: Internal Medicine

## 2018-12-26 ENCOUNTER — Encounter: Payer: Self-pay | Admitting: Radiation Oncology

## 2018-12-26 VITALS — BP 137/81 | HR 62 | Temp 98.2°F | Resp 18 | Ht 71.0 in | Wt 165.5 lb

## 2018-12-26 VITALS — BP 137/82 | HR 62 | Temp 98.2°F | Resp 17

## 2018-12-26 DIAGNOSIS — Z791 Long term (current) use of non-steroidal anti-inflammatories (NSAID): Secondary | ICD-10-CM | POA: Insufficient documentation

## 2018-12-26 DIAGNOSIS — Z7982 Long term (current) use of aspirin: Secondary | ICD-10-CM | POA: Insufficient documentation

## 2018-12-26 DIAGNOSIS — F17210 Nicotine dependence, cigarettes, uncomplicated: Secondary | ICD-10-CM | POA: Insufficient documentation

## 2018-12-26 DIAGNOSIS — Z5111 Encounter for antineoplastic chemotherapy: Secondary | ICD-10-CM | POA: Diagnosis present

## 2018-12-26 DIAGNOSIS — M1990 Unspecified osteoarthritis, unspecified site: Secondary | ICD-10-CM

## 2018-12-26 DIAGNOSIS — C3490 Malignant neoplasm of unspecified part of unspecified bronchus or lung: Secondary | ICD-10-CM | POA: Insufficient documentation

## 2018-12-26 DIAGNOSIS — Z79899 Other long term (current) drug therapy: Secondary | ICD-10-CM | POA: Insufficient documentation

## 2018-12-26 DIAGNOSIS — C3431 Malignant neoplasm of lower lobe, right bronchus or lung: Secondary | ICD-10-CM | POA: Diagnosis present

## 2018-12-26 DIAGNOSIS — C3491 Malignant neoplasm of unspecified part of right bronchus or lung: Secondary | ICD-10-CM

## 2018-12-26 DIAGNOSIS — Z510 Encounter for antineoplastic radiation therapy: Secondary | ICD-10-CM | POA: Insufficient documentation

## 2018-12-26 LAB — CMP (CANCER CENTER ONLY)
ALT: 37 U/L (ref 0–44)
AST: 19 U/L (ref 15–41)
Albumin: 4 g/dL (ref 3.5–5.0)
Alkaline Phosphatase: 102 U/L (ref 38–126)
Anion gap: 11 (ref 5–15)
BUN: 11 mg/dL (ref 6–20)
CO2: 25 mmol/L (ref 22–32)
Calcium: 9.3 mg/dL (ref 8.9–10.3)
Chloride: 106 mmol/L (ref 98–111)
Creatinine: 0.79 mg/dL (ref 0.61–1.24)
GFR, Est AFR Am: >60 mL/min
GFR, Estimated: >60 mL/min
Glucose, Bld: 111 mg/dL — ABNORMAL HIGH (ref 70–99)
Potassium: 3.7 mmol/L (ref 3.5–5.1)
Sodium: 142 mmol/L (ref 135–145)
Total Bilirubin: 0.6 mg/dL (ref 0.3–1.2)
Total Protein: 7.3 g/dL (ref 6.5–8.1)

## 2018-12-26 LAB — CBC WITH DIFFERENTIAL (CANCER CENTER ONLY)
Abs Immature Granulocytes: 0.13 10*3/uL — ABNORMAL HIGH (ref 0.00–0.07)
Basophils Absolute: 0 10*3/uL (ref 0.0–0.1)
Basophils Relative: 0 %
Eosinophils Absolute: 0.2 10*3/uL (ref 0.0–0.5)
Eosinophils Relative: 2 %
HCT: 45.5 % (ref 39.0–52.0)
Hemoglobin: 14.7 g/dL (ref 13.0–17.0)
Immature Granulocytes: 1 %
Lymphocytes Relative: 19 %
Lymphs Abs: 1.9 10*3/uL (ref 0.7–4.0)
MCH: 30.6 pg (ref 26.0–34.0)
MCHC: 32.3 g/dL (ref 30.0–36.0)
MCV: 94.8 fL (ref 80.0–100.0)
Monocytes Absolute: 0.8 10*3/uL (ref 0.1–1.0)
Monocytes Relative: 8 %
Neutro Abs: 6.7 10*3/uL (ref 1.7–7.7)
Neutrophils Relative %: 70 %
Platelet Count: 190 10*3/uL (ref 150–400)
RBC: 4.8 MIL/uL (ref 4.22–5.81)
RDW: 12.3 % (ref 11.5–15.5)
WBC Count: 9.8 10*3/uL (ref 4.0–10.5)
nRBC: 0 % (ref 0.0–0.2)

## 2018-12-26 MED ORDER — DIPHENHYDRAMINE HCL 50 MG/ML IJ SOLN
INTRAMUSCULAR | Status: AC
  Filled 2018-12-26: qty 1

## 2018-12-26 MED ORDER — PALONOSETRON HCL INJECTION 0.25 MG/5ML
INTRAVENOUS | Status: AC
  Filled 2018-12-26: qty 5

## 2018-12-26 MED ORDER — SODIUM CHLORIDE 0.9 % IV SOLN
45.0000 mg/m2 | Freq: Once | INTRAVENOUS | Status: AC
  Administered 2018-12-26: 84 mg via INTRAVENOUS
  Filled 2018-12-26: qty 14

## 2018-12-26 MED ORDER — FAMOTIDINE IN NACL 20-0.9 MG/50ML-% IV SOLN
INTRAVENOUS | Status: AC
  Filled 2018-12-26: qty 50

## 2018-12-26 MED ORDER — FAMOTIDINE IN NACL 20-0.9 MG/50ML-% IV SOLN
20.0000 mg | Freq: Once | INTRAVENOUS | Status: AC
  Administered 2018-12-26: 20 mg via INTRAVENOUS

## 2018-12-26 MED ORDER — SODIUM CHLORIDE 0.9 % IV SOLN
20.0000 mg | Freq: Once | INTRAVENOUS | Status: AC
  Administered 2018-12-26: 20 mg via INTRAVENOUS
  Filled 2018-12-26: qty 2

## 2018-12-26 MED ORDER — DIPHENHYDRAMINE HCL 50 MG/ML IJ SOLN
50.0000 mg | Freq: Once | INTRAMUSCULAR | Status: AC
  Administered 2018-12-26: 50 mg via INTRAVENOUS

## 2018-12-26 MED ORDER — SODIUM CHLORIDE 0.9 % IV SOLN
262.2000 mg | Freq: Once | INTRAVENOUS | Status: AC
  Administered 2018-12-26: 260 mg via INTRAVENOUS
  Filled 2018-12-26: qty 26

## 2018-12-26 MED ORDER — PALONOSETRON HCL INJECTION 0.25 MG/5ML
0.2500 mg | Freq: Once | INTRAVENOUS | Status: AC
  Administered 2018-12-26: 0.25 mg via INTRAVENOUS

## 2018-12-26 MED ORDER — SODIUM CHLORIDE 0.9 % IV SOLN
Freq: Once | INTRAVENOUS | Status: AC
  Administered 2018-12-26: 09:00:00 via INTRAVENOUS
  Filled 2018-12-26: qty 250

## 2018-12-26 NOTE — Patient Instructions (Signed)
[facility] Discharge Instructions for Patients Receiving Chemotherapy  Today you received the following chemotherapy agents: Paclitaxel (Taxol) and Carboplatin (Paraplatin)  To help prevent nausea and vomiting after your treatment, we encourage you to take your nausea medication as directed.    If you develop nausea and vomiting that is not controlled by your nausea medication, call the clinic.   BELOW ARE SYMPTOMS THAT SHOULD BE REPORTED IMMEDIATELY:  *FEVER GREATER THAN 100.5 F  *CHILLS WITH OR WITHOUT FEVER  NAUSEA AND VOMITING THAT IS NOT CONTROLLED WITH YOUR NAUSEA MEDICATION  *UNUSUAL SHORTNESS OF BREATH  *UNUSUAL BRUISING OR BLEEDING  TENDERNESS IN MOUTH AND THROAT WITH OR WITHOUT PRESENCE OF ULCERS  *URINARY PROBLEMS  *BOWEL PROBLEMS  UNUSUAL RASH Items with * indicate a potential emergency and should be followed up as soon as possible.  Feel free to call the clinic should you have any questions or concerns. The clinic phone number is [phone].  Please show the [facility] at check-in to the Emergency Department and triage nurse.  Paclitaxel injection What is this medicine? PACLITAXEL (PAK li TAX el) is a chemotherapy drug. It targets fast dividing cells, like cancer cells, and causes these cells to die. This medicine is used to treat ovarian cancer, breast cancer, lung cancer, Kaposi's sarcoma, and other cancers. This medicine may be used for other purposes; ask your health care provider or pharmacist if you have questions. COMMON BRAND NAME(S): Onxol, Taxol What should I tell my health care provider before I take this medicine? They need to know if you have any of these conditions: -history of irregular heartbeat -liver disease -low blood counts, like low white cell, platelet, or red cell counts -lung or breathing disease, like asthma -tingling of the fingers or toes, or other nerve disorder -an unusual or allergic reaction to  paclitaxel, alcohol, polyoxyethylated castor oil, other chemotherapy, other medicines, foods, dyes, or preservatives -pregnant or trying to get pregnant -breast-feeding How should I use this medicine? This drug is given as an infusion into a vein. It is administered in a hospital or clinic by a specially trained health care professional. Talk to your pediatrician regarding the use of this medicine in children. Special care may be needed. Overdosage: If you think you have taken too much of this medicine contact a poison control center or emergency room at once. NOTE: This medicine is only for you. Do not share this medicine with others. What if I miss a dose? It is important not to miss your dose. Call your doctor or health care professional if you are unable to keep an appointment. What may interact with this medicine? Do not take this medicine with any of the following medications: -disulfiram -metronidazole This medicine may also interact with the following medications: -antiviral medicines for hepatitis, HIV or AIDS -certain antibiotics like erythromycin and clarithromycin -certain medicines for fungal infections like ketoconazole and itraconazole -certain medicines for seizures like carbamazepine, phenobarbital, phenytoin -gemfibrozil -nefazodone -rifampin -St. John's wort This list may not describe all possible interactions. Give your health care provider a list of all the medicines, herbs, non-prescription drugs, or dietary supplements you use. Also tell them if you smoke, drink alcohol, or use illegal drugs. Some items may interact with your medicine. What should I watch for while using this medicine? Your condition will be monitored carefully while you are receiving this medicine. You will need important blood work done while you are taking this medicine. This medicine can cause serious allergic  reactions. To reduce your risk you will need to take other medicine(s) before treatment  with this medicine. If you experience allergic reactions like skin rash, itching or hives, swelling of the face, lips, or tongue, tell your doctor or health care professional right away. In some cases, you may be given additional medicines to help with side effects. Follow all directions for their use. This drug may make you feel generally unwell. This is not uncommon, as chemotherapy can affect healthy cells as well as cancer cells. Report any side effects. Continue your course of treatment even though you feel ill unless your doctor tells you to stop. Call your doctor or health care professional for advice if you get a fever, chills or sore throat, or other symptoms of a cold or flu. Do not treat yourself. This drug decreases your body's ability to fight infections. Try to avoid being around people who are sick. This medicine may increase your risk to bruise or bleed. Call your doctor or health care professional if you notice any unusual bleeding. Be careful brushing and flossing your teeth or using a toothpick because you may get an infection or bleed more easily. If you have any dental work done, tell your dentist you are receiving this medicine. Avoid taking products that contain aspirin, acetaminophen, ibuprofen, naproxen, or ketoprofen unless instructed by your doctor. These medicines may hide a fever. Do not become pregnant while taking this medicine. Women should inform their doctor if they wish to become pregnant or think they might be pregnant. There is a potential for serious side effects to an unborn child. Talk to your health care professional or pharmacist for more information. Do not breast-feed an infant while taking this medicine. Men are advised not to father a child while receiving this medicine. This product may contain alcohol. Ask your pharmacist or healthcare provider if this medicine contains alcohol. Be sure to tell all healthcare providers you are taking this medicine. Certain  medicines, like metronidazole and disulfiram, can cause an unpleasant reaction when taken with alcohol. The reaction includes flushing, headache, nausea, vomiting, sweating, and increased thirst. The reaction can last from 30 minutes to several hours. What side effects may I notice from receiving this medicine? Side effects that you should report to your doctor or health care professional as soon as possible: -allergic reactions like skin rash, itching or hives, swelling of the face, lips, or tongue -breathing problems -changes in vision -fast, irregular heartbeat -high or low blood pressure -mouth sores -pain, tingling, numbness in the hands or feet -signs of decreased platelets or bleeding - bruising, pinpoint red spots on the skin, black, tarry stools, blood in the urine -signs of decreased red blood cells - unusually weak or tired, feeling faint or lightheaded, falls -signs of infection - fever or chills, cough, sore throat, pain or difficulty passing urine -signs and symptoms of liver injury like dark yellow or brown urine; general ill feeling or flu-like symptoms; light-colored stools; loss of appetite; nausea; right upper belly pain; unusually weak or tired; yellowing of the eyes or skin -swelling of the ankles, feet, hands -unusually slow heartbeat Side effects that usually do not require medical attention (report to your doctor or health care professional if they continue or are bothersome): -diarrhea -hair loss -loss of appetite -muscle or joint pain -nausea, vomiting -pain, redness, or irritation at site where injected -tiredness This list may not describe all possible side effects. Call your doctor for medical advice about side effects. You may  report side effects to FDA at 1-800-FDA-1088. Where should I keep my medicine? This drug is given in a hospital or clinic and will not be stored at home. NOTE: This sheet is a summary. It may not cover all possible information. If you  have questions about this medicine, talk to your doctor, pharmacist, or health care provider.  2019 Elsevier/Gold Standard (2017-07-13 13:14:55)  Carboplatin injection What is this medicine? CARBOPLATIN (KAR boe pla tin) is a chemotherapy drug. It targets fast dividing cells, like cancer cells, and causes these cells to die. This medicine is used to treat ovarian cancer and many other cancers. This medicine may be used for other purposes; ask your health care provider or pharmacist if you have questions. COMMON BRAND NAME(S): Paraplatin What should I tell my health care provider before I take this medicine? They need to know if you have any of these conditions: -blood disorders -hearing problems -kidney disease -recent or ongoing radiation therapy -an unusual or allergic reaction to carboplatin, cisplatin, other chemotherapy, other medicines, foods, dyes, or preservatives -pregnant or trying to get pregnant -breast-feeding How should I use this medicine? This drug is usually given as an infusion into a vein. It is administered in a hospital or clinic by a specially trained health care professional. Talk to your pediatrician regarding the use of this medicine in children. Special care may be needed. Overdosage: If you think you have taken too much of this medicine contact a poison control center or emergency room at once. NOTE: This medicine is only for you. Do not share this medicine with others. What if I miss a dose? It is important not to miss a dose. Call your doctor or health care professional if you are unable to keep an appointment. What may interact with this medicine? -medicines for seizures -medicines to increase blood counts like filgrastim, pegfilgrastim, sargramostim -some antibiotics like amikacin, gentamicin, neomycin, streptomycin, tobramycin -vaccines Talk to your doctor or health care professional before taking any of these  medicines: -acetaminophen -aspirin -ibuprofen -ketoprofen -naproxen This list may not describe all possible interactions. Give your health care provider a list of all the medicines, herbs, non-prescription drugs, or dietary supplements you use. Also tell them if you smoke, drink alcohol, or use illegal drugs. Some items may interact with your medicine. What should I watch for while using this medicine? Your condition will be monitored carefully while you are receiving this medicine. You will need important blood work done while you are taking this medicine. This drug may make you feel generally unwell. This is not uncommon, as chemotherapy can affect healthy cells as well as cancer cells. Report any side effects. Continue your course of treatment even though you feel ill unless your doctor tells you to stop. In some cases, you may be given additional medicines to help with side effects. Follow all directions for their use. Call your doctor or health care professional for advice if you get a fever, chills or sore throat, or other symptoms of a cold or flu. Do not treat yourself. This drug decreases your body's ability to fight infections. Try to avoid being around people who are sick. This medicine may increase your risk to bruise or bleed. Call your doctor or health care professional if you notice any unusual bleeding. Be careful brushing and flossing your teeth or using a toothpick because you may get an infection or bleed more easily. If you have any dental work done, tell your dentist you are receiving this medicine.  Avoid taking products that contain aspirin, acetaminophen, ibuprofen, naproxen, or ketoprofen unless instructed by your doctor. These medicines may hide a fever. Do not become pregnant while taking this medicine. Women should inform their doctor if they wish to become pregnant or think they might be pregnant. There is a potential for serious side effects to an unborn child. Talk to  your health care professional or pharmacist for more information. Do not breast-feed an infant while taking this medicine. What side effects may I notice from receiving this medicine? Side effects that you should report to your doctor or health care professional as soon as possible: -allergic reactions like skin rash, itching or hives, swelling of the face, lips, or tongue -signs of infection - fever or chills, cough, sore throat, pain or difficulty passing urine -signs of decreased platelets or bleeding - bruising, pinpoint red spots on the skin, black, tarry stools, nosebleeds -signs of decreased red blood cells - unusually weak or tired, fainting spells, lightheadedness -breathing problems -changes in hearing -changes in vision -chest pain -high blood pressure -low blood counts - This drug may decrease the number of white blood cells, red blood cells and platelets. You may be at increased risk for infections and bleeding. -nausea and vomiting -pain, swelling, redness or irritation at the injection site -pain, tingling, numbness in the hands or feet -problems with balance, talking, walking -trouble passing urine or change in the amount of urine Side effects that usually do not require medical attention (report to your doctor or health care professional if they continue or are bothersome): -hair loss -loss of appetite -metallic taste in the mouth or changes in taste This list may not describe all possible side effects. Call your doctor for medical advice about side effects. You may report side effects to FDA at 1-800-FDA-1088. Where should I keep my medicine? This drug is given in a hospital or clinic and will not be stored at home. NOTE: This sheet is a summary. It may not cover all possible information. If you have questions about this medicine, talk to your doctor, pharmacist, or health care provider.  2019 Elsevier/Gold Standard (2008-02-14 14:38:05)

## 2018-12-26 NOTE — Progress Notes (Signed)
Radiation Oncology         [phone] ________________________________  Initial Outpatient Consultation  Name: Nathan [name] MRN: [id number]  Date: 12/26/2018  DOB: 1961/06/08  CB:JSEGBT, Pcp Not In  [name], MD   REFERRING PHYSICIAN: [name], MD  DIAGNOSIS: The encounter diagnosis was Non-small cell carcinoma of right lung, stage 3 ([place]).   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 male who is presenting to the office today for evaluation of his lung cancer. He received chemotherapy today (Paclitaxel and Carboplatin administered weekly). During surgical exploration, it was discovered that the right hilum is involved, as well as the main pulomnary artery, right upper, right middle, and right lower lobes, consistent with poorly differentiated carcinoma with mediastinal lymph node involvement in a station 4R diagnosed in December 2019. Surgical resection was not possible.  On June 13, 2018 the patient underwent bronchoscopy with EBUS biopsies. The lower lobe lung mass showed few atypical viable epithelial cells present in a necrotic background suspicious for malignancy. Biopsy of the 4R lymph node showed no evidence for metastatic disease. He was then referred to Dr. Lianne Moris at [facility] for discussion of resection of his suspicious right lower lobe lung nodule.   He underwent right robotic assisted lower lobe mass biopsy with mediastinal lymph node dissection on November 01, 2018 with Dr. Lianne Moris. Biopsies revealed invasive poorly differentiated carcinoma. The dissected lymph node from level 4R also showed metastatic poorly differentiated carcinoma.   Also at [facility], he had a chest X-ray on December 02, 2018 that showed the right hilar mass was better characterized on prior cross sectional imaging (chest X-ray dated 11/02/18 and CT from 05/27/18). A small pleural effusion  was noted.   he denies any concerning complaints today.    PREVIOUS RADIATION THERAPY: No  PAST MEDICAL HISTORY:  has a past medical history of Arthritis, Family history of adverse reaction to anesthesia, and History of kidney stones.    PAST SURGICAL HISTORY: Past Surgical History:  Procedure Laterality Date  . HAND SURGERY Right 2008   thumb with pin  . LITHOTRIPSY    . TOTAL HIP ARTHROPLASTY Right 08/30/2017   Procedure: TOTAL HIP ARTHROPLASTY ANTERIOR APPROACH;  Surgeon: Rod Can, MD;  Location: [facility];  Service: Orthopedics;  Laterality: Right;  . TOTAL HIP ARTHROPLASTY Left 11/29/2017   Procedure: LEFT TOTAL HIP ARTHROPLASTY ANTERIOR APPROACH;  Surgeon: Rod Can, MD;  Location: [facility];  Service: Orthopedics;  Laterality: Left;  Needs RNFA    FAMILY HISTORY: family history includes Anemia (age of onset: 39) in his father; Cancer - Colon (age of onset: 81) in his mother.  SOCIAL HISTORY:  reports that he has been smoking. He has smoked for the past 20.00 years. He has never used smokeless tobacco. He reports current alcohol use of about 12.0 standard drinks of alcohol per week. He reports current drug use. Frequency: 14.00 times per week. Drug: Marijuana.  ALLERGIES: Patient has no known allergies.  MEDICATIONS:  Current Outpatient Medications  Medication Sig Dispense Refill  . aspirin 81 MG chewable tablet Chew 81 mg by mouth daily.    . naproxen (NAPROSYN) 500 MG tablet Take 500 mg by mouth 2 (two) times daily with a meal.    . nicotine (NICODERM CQ - DOSED IN MG/24 HR) 7 mg/24hr patch Place 7 mg onto the skin daily.    . nicotine polacrilex (  NICORETTE) 2 MG gum Take 2 mg by mouth as needed for smoking cessation.    . prochlorperazine (COMPAZINE) 10 MG tablet Take 1 tablet (10 mg total) by mouth every 6 (six) hours as needed for nausea or vomiting. 30 tablet 0  . senna-docusate (SENOKOT-S) 8.6-50 MG tablet Take 2 tablets by mouth at bedtime as needed for mild  constipation.    . sildenafil (VIAGRA) 100 MG tablet Take 50 mg by mouth daily as needed for erectile dysfunction.     No current facility-administered medications for this encounter.     REVIEW OF SYSTEMS:  A 10+ POINT REVIEW OF SYSTEMS WAS OBTAINED including neurology, dermatology, psychiatry, cardiac, respiratory, lymph, extremities, GI, GU, musculoskeletal, constitutional, reproductive, HEENT. All pertinent positives are noted in the HPI. All others are negative.    PHYSICAL EXAM:  temperature is 98.2 F (36.8 C). His blood pressure is 137/82 and his pulse is 62. His respiration is 17 and oxygen saturation is 98%.   General: Alert and oriented, in no acute distress HEENT: Head is normocephalic. Extraocular movements are intact. Oropharynx is clear. Neck: Neck is supple, no palpable cervical or supraclavicular lymphadenopathy. Heart: Regular in rate and rhythm with no murmurs, rubs, or gallops. Chest: Clear to auscultation bilaterally, with no rhonchi, wheezes, or rales. Abdomen: Soft, nontender, nondistended, with no rigidity or guarding. Extremities: No cyanosis or edema. Lymphatics: see Neck Exam Skin: No concerning lesions. Musculoskeletal: symmetric strength and muscle tone throughout. Neurologic: Cranial nerves II through XII are grossly intact. No obvious focalities. Speech is fluent. Coordination is intact. Psychiatric: Judgment and insight are intact. Affect is appropriate.   ECOG = 1  0 - Asymptomatic (Fully active, able to carry on all predisease activities without restriction)  1 - Symptomatic but completely ambulatory (Restricted in physically strenuous activity but ambulatory and able to carry out work of a light or sedentary nature. For example, light housework, office work)  2 - Symptomatic, <50% in bed during the day (Ambulatory and capable of all self care but unable to carry out any work activities. Up and about more than 50% of waking hours)  3 - Symptomatic,  >50% in bed, but not bedbound (Capable of only limited self-care, confined to bed or chair 50% or more of waking hours)  4 - Bedbound (Completely disabled. Cannot carry on any self-care. Totally confined to bed or chair)  5 - Death   Eustace Pen MM, Creech RH, Tormey DC, et al. [phone]). "Toxicity and response criteria of the [facility] [employer]". [place] Oncol. 5 (6): 649-55  LABORATORY DATA:  Lab Results  Component Value Date   WBC 9.8 12/26/2018   HGB 14.7 12/26/2018   HCT 45.5 12/26/2018   MCV 94.8 12/26/2018   PLT 190 12/26/2018   NEUTROABS 6.7 12/26/2018   Lab Results  Component Value Date   NA 142 12/26/2018   K 3.7 12/26/2018   CL 106 12/26/2018   CO2 25 12/26/2018   GLUCOSE 111 (H) 12/26/2018   CREATININE 0.79 12/26/2018   CALCIUM 9.3 12/26/2018      RADIOGRAPHY: No results found.    IMPRESSION: Stage IIIA non-small cell lung cancer. Pt would be a good candidate for preoperative radiation therapy, along with radio sensitizing chemotherapy in an effort to shrink his tumor so that he can become resectable. If he is deemed unresectable, he will return to [facility] for 2 weeks of radiation therapy along with radio-sensitizing chemotherapy.   Today, I talked to the patient about  the findings and work-up thus far.  We discussed the natural history of non-small cell and general treatment, highlighting the role of radiotherapy in the management.  We discussed the available radiation techniques, and focused on the details of logistics and delivery.  We reviewed the anticipated acute and late sequelae associated with radiation in this setting.  The patient was encouraged to ask questions that I answered to the best of my ability.  A patient consent form was discussed and signed.  We retained a copy for our records.  The patient would like to proceed with radiation and will be scheduled for CT simulation.   PLAN: He will return tomorrow for CT simulation at [facility]. He  will proceed with preoperative radiation therapy to a dose of 45 Gy in 25 fractions along with radiosensitizing chemotherapy. He will then be evaluated for surgical resection.    ------------------------------------------------  Blair Promise, PhD, MD      This document serves as a record of services personally performed by Gery Pray, MD. It was created on his behalf by Mary-Margaret Loma Messing, a trained medical scribe. The creation of this record is based on the scribe's personal observations and the provider's statements to them. This document has been checked and approved by the attending provider.

## 2018-12-27 ENCOUNTER — Ambulatory Visit
Admission: RE | Admit: 2018-12-27 | Discharge: 2018-12-27 | Disposition: A | Discharge status: Home / Self Care | Payer: No Typology Code available for payment source | Source: Ambulatory Visit | Attending: Radiation Oncology | Admitting: Radiation Oncology

## 2018-12-27 ENCOUNTER — Telehealth: Payer: Self-pay | Admitting: Internal Medicine

## 2018-12-27 ENCOUNTER — Telehealth: Payer: Self-pay | Admitting: *Deleted

## 2018-12-27 ENCOUNTER — Other Ambulatory Visit: Payer: Self-pay | Admitting: Medical Oncology

## 2018-12-27 DIAGNOSIS — C3491 Malignant neoplasm of unspecified part of right bronchus or lung: Secondary | ICD-10-CM

## 2018-12-27 DIAGNOSIS — Z5111 Encounter for antineoplastic chemotherapy: Secondary | ICD-10-CM | POA: Diagnosis not present

## 2018-12-27 MED ORDER — PROCHLORPERAZINE MALEATE 10 MG PO TABS: 10.0000 mg | ORAL_TABLET | Freq: Four times a day (QID) | ORAL | 0 refills | Status: DC | PRN

## 2018-12-27 NOTE — Telephone Encounter (Signed)
-----   Message from Zola Button, RN sent at 12/26/2018  1:00 PM EST ----- Regarding: Dr. Julien Nordmann; First time F/U Call Pt received first time Taxol and Carboplatin. Tolerated well. Seemed very angry about situation (just a heads up). He also needs future appts to be in the morning because he has to get his kids off the bus by 2:15pm (I sent scheduling a message but they didn't change it before he left today). Thanks! --[employer]

## 2018-12-27 NOTE — Telephone Encounter (Signed)
TCT patient to follow up with him after his 1st treatment of Taxol/Carboplatin yesterday.  Spoke with patient. He states he feels ok today. Denies fever, chills, nausea, vomiting etc.  He actually has not picked up his compazine prescription yet as he does not have enough money to get it.  He did ask about having his prescriptions sent to the [facility] in [place]. Also pt states that transportation is an issue, especially with Radiation Treatments starting next week-they will be every day for 5 weeks, in addition to his  Chemo therapy. I informed him about our transportation  Program that could of great assistance to him.  He is agreeable to that.  I will let the Transportation coordinator know about his situation.  He was very Patent attorney.  Pt also needs his chemo therapy appts in the morning as he has 2 children he needs to pick up at their bus stop @ 2:30. High priority scheduling message sent for that.

## 2018-12-27 NOTE — Telephone Encounter (Signed)
R/s appt per 2/3 sch message - called patient and left message with new date and time

## 2018-12-27 NOTE — Progress Notes (Signed)
  Radiation Oncology         [phone] ________________________________  Name: Nathan [name] MRN: [id number]  Date: 12/27/2018  DOB: May 06, 1961  SIMULATION AND TREATMENT PLANNING NOTE    ICD-10-CM   1. Non-small cell carcinoma of right lung, stage 3 (HCC) C34.91     DIAGNOSIS:  stage IIIA(T2 a, N2, M0) non-small cell lung cancer   NARRATIVE:  The patient was brought to the [facility].  Identity was confirmed.  All relevant records and images related to the planned course of therapy were reviewed.  The patient freely provided informed written consent to proceed with treatment after reviewing the details related to the planned course of therapy. The consent form was witnessed and verified by the simulation staff.  Then, the patient was set-up in a stable reproducible  supine position for radiation therapy.  CT images were obtained.  Surface markings were placed.  The CT images were loaded into the planning software.  Then the target and avoidance structures were contoured.  Treatment planning then occurred.  The radiation prescription was entered and confirmed.  Then, I designed and supervised the construction of a total of 5 medically necessary complex treatment devices.  I have requested : 3D Simulation  I have requested a DVH of the following structures: GTV, PTV, heart, lungs, esophagus, spinal cord.  I have ordered:dose calc.  PLAN:  The patient will receive 45 Gy in 25 fractions directed at the right perihilar mass and mediastinal area. Patient will then be evaluated for potential surgery at [facility].  Special Treatment Procedure Note: The patient will be receiving radiosensitizing chemotherapy. Given the potential of increased toxicities related to combined therapy and the necessity for close monitoring of the patient and blood work, this constitutes a special treatment procedure.  -----------------------------------  Blair Promise, PhD, MD

## 2018-12-30 ENCOUNTER — Other Ambulatory Visit: Payer: Self-pay | Admitting: *Deleted

## 2018-12-30 ENCOUNTER — Telehealth: Payer: Self-pay | Admitting: *Deleted

## 2018-12-30 DIAGNOSIS — Z5111 Encounter for antineoplastic chemotherapy: Secondary | ICD-10-CM

## 2018-12-30 NOTE — Telephone Encounter (Signed)
[place] Work  Clinical Social Work attempted to contact patient regarding possible transportation needs.  CSW left voicemail requesting patient return call.  Gwinda Maine, LCSW  Clinical Social Worker [facility]

## 2019-01-02 ENCOUNTER — Ambulatory Visit: Payer: No Typology Code available for payment source | Admitting: Adult Health

## 2019-01-02 ENCOUNTER — Other Ambulatory Visit: Payer: No Typology Code available for payment source

## 2019-01-02 ENCOUNTER — Inpatient Hospital Stay: Payer: No Typology Code available for payment source

## 2019-01-02 ENCOUNTER — Inpatient Hospital Stay (HOSPITAL_BASED_OUTPATIENT_CLINIC_OR_DEPARTMENT_OTHER): Payer: No Typology Code available for payment source | Admitting: Medical

## 2019-01-02 ENCOUNTER — Encounter: Payer: Self-pay | Admitting: Medical

## 2019-01-02 VITALS — BP 118/74 | HR 70 | Temp 98.6°F | Resp 18 | Wt 167.8 lb

## 2019-01-02 DIAGNOSIS — Z720 Tobacco use: Secondary | ICD-10-CM | POA: Diagnosis not present

## 2019-01-02 DIAGNOSIS — C3491 Malignant neoplasm of unspecified part of right bronchus or lung: Secondary | ICD-10-CM

## 2019-01-02 DIAGNOSIS — Z5111 Encounter for antineoplastic chemotherapy: Secondary | ICD-10-CM | POA: Diagnosis not present

## 2019-01-02 LAB — CBC WITH DIFFERENTIAL (CANCER CENTER ONLY)
Abs Immature Granulocytes: 0.15 10*3/uL — ABNORMAL HIGH (ref 0.00–0.07)
Basophils Absolute: 0.1 10*3/uL (ref 0.0–0.1)
Basophils Relative: 1 %
Eosinophils Absolute: 0.3 10*3/uL (ref 0.0–0.5)
Eosinophils Relative: 3 %
HCT: 42.1 % (ref 39.0–52.0)
Hemoglobin: 14.3 g/dL (ref 13.0–17.0)
Immature Granulocytes: 1 %
Lymphocytes Relative: 22 %
Lymphs Abs: 2.4 10*3/uL (ref 0.7–4.0)
MCH: 31.6 pg (ref 26.0–34.0)
MCHC: 34 g/dL (ref 30.0–36.0)
MCV: 92.9 fL (ref 80.0–100.0)
MONO ABS: 0.9 10*3/uL (ref 0.1–1.0)
Monocytes Relative: 8 %
Neutro Abs: 7.2 10*3/uL (ref 1.7–7.7)
Neutrophils Relative %: 65 %
Platelet Count: 208 10*3/uL (ref 150–400)
RBC: 4.53 MIL/uL (ref 4.22–5.81)
RDW: 12.2 % (ref 11.5–15.5)
WBC Count: 11 10*3/uL — ABNORMAL HIGH (ref 4.0–10.5)
nRBC: 0 % (ref 0.0–0.2)

## 2019-01-02 LAB — CMP (CANCER CENTER ONLY)
ALT: 35 U/L (ref 0–44)
AST: 24 U/L (ref 15–41)
Albumin: 4 g/dL (ref 3.5–5.0)
Alkaline Phosphatase: 103 U/L (ref 38–126)
Anion gap: 9 (ref 5–15)
BUN: 14 mg/dL (ref 6–20)
CO2: 25 mmol/L (ref 22–32)
Calcium: 9.5 mg/dL (ref 8.9–10.3)
Chloride: 104 mmol/L (ref 98–111)
Creatinine: 0.76 mg/dL (ref 0.61–1.24)
GFR, Est AFR Am: >60 mL/min (ref >60)
GFR, Estimated: >60 mL/min (ref >60)
Glucose, Bld: 115 mg/dL — ABNORMAL HIGH (ref 70–99)
Potassium: 3.8 mmol/L (ref 3.5–5.1)
Sodium: 138 mmol/L (ref 135–145)
Total Bilirubin: 0.8 mg/dL (ref 0.3–1.2)
Total Protein: 7.3 g/dL (ref 6.5–8.1)

## 2019-01-02 MED ORDER — DIPHENHYDRAMINE HCL 50 MG/ML IJ SOLN
50.0000 mg | Freq: Once | INTRAMUSCULAR | Status: AC
  Administered 2019-01-02: 50 mg via INTRAVENOUS

## 2019-01-02 MED ORDER — SODIUM CHLORIDE 0.9 % IV SOLN
20.0000 mg | Freq: Once | INTRAVENOUS | Status: AC
  Administered 2019-01-02: 20 mg via INTRAVENOUS
  Filled 2019-01-02: qty 2

## 2019-01-02 MED ORDER — SODIUM CHLORIDE 0.9 % IV SOLN
262.2000 mg | Freq: Once | INTRAVENOUS | Status: AC
  Administered 2019-01-02: 260 mg via INTRAVENOUS
  Filled 2019-01-02: qty 26

## 2019-01-02 MED ORDER — PALONOSETRON HCL INJECTION 0.25 MG/5ML
0.2500 mg | Freq: Once | INTRAVENOUS | Status: AC
  Administered 2019-01-02: 0.25 mg via INTRAVENOUS

## 2019-01-02 MED ORDER — FAMOTIDINE IN NACL 20-0.9 MG/50ML-% IV SOLN
20.0000 mg | Freq: Once | INTRAVENOUS | Status: AC
  Administered 2019-01-02: 20 mg via INTRAVENOUS

## 2019-01-02 MED ORDER — DIPHENHYDRAMINE HCL 50 MG/ML IJ SOLN
INTRAMUSCULAR | Status: AC
  Filled 2019-01-02: qty 1

## 2019-01-02 MED ORDER — SODIUM CHLORIDE 0.9 % IV SOLN
45.0000 mg/m2 | Freq: Once | INTRAVENOUS | Status: AC
  Administered 2019-01-02: 84 mg via INTRAVENOUS
  Filled 2019-01-02: qty 14

## 2019-01-02 MED ORDER — PALONOSETRON HCL INJECTION 0.25 MG/5ML
INTRAVENOUS | Status: AC
  Filled 2019-01-02: qty 5

## 2019-01-02 MED ORDER — SODIUM CHLORIDE 0.9 % IV SOLN
Freq: Once | INTRAVENOUS | Status: AC
  Administered 2019-01-02: 10:00:00 via INTRAVENOUS
  Filled 2019-01-02: qty 250

## 2019-01-02 MED ORDER — FAMOTIDINE IN NACL 20-0.9 MG/50ML-% IV SOLN
INTRAVENOUS | Status: AC
  Filled 2019-01-02: qty 50

## 2019-01-02 NOTE — Progress Notes (Signed)
Went to infusion to introduce myself as Arboriculturist and to offer available resources.  Patient was asleep. Left my card at the bedside with my contact name and number for any additional financial questions or concerns. Also wrote on back information to apply for one-time $700 [place].

## 2019-01-02 NOTE — Progress Notes (Signed)
[facility]pt[facility] Clinic Progress Note   Lino G [name] [id number] 05-Jan-1961 58 y.o.  [name] is managed by Dr. Fanny Bien. Mohamed  Actively treated with chemotherapy/immunotherapy/hormonal therapy: yes  Current Therapy: Carboplatin and paclitaxel with plans to begin concurrent radiation therapy tomorrow  Last Treated: 12/26/2018 (cycle 1, day 1 of carboplatin and paclitaxel)  Assessment: Plan:    Non-small cell carcinoma of right lung, stage 3 ([name])   Stage IIIa non-small cell lung cancer: [name] presents to the clinic today for consideration of cycle 2, day 1 of carboplatin and paclitaxel.  He is seen in the infusion room.  We will proceed with his treatment today.  He is scheduled to begin radiation therapy tomorrow.  He will return to clinic in 1 week for consideration of cycle 3, day 1 of chemotherapy.  Please see After Visit Summary for patient specific instructions.  Future Appointments  Date Time Provider [place]  01/03/2019  9:15 AM Gery Pray, MD [facility] None  01/04/2019  9:00 AM [facility] LINAC 1 [facility] None  01/05/2019  7:30 AM [facility] LINAC 1 [facility] None  01/06/2019  7:30 AM [facility] LINAC 1 [facility] None  01/09/2019  7:30 AM [facility] LINAC 1 [facility] None  01/09/2019  7:45 AM [facility] LAB 3 [facility] None  01/09/2019  8:30 AM [facility] INFUSION [facility] None  01/10/2019  7:30 AM [facility] LINAC 1 [facility] None  01/11/2019 11:45 AM [facility] LINAC 1 [facility] None  01/12/2019 11:15 AM [facility] LINAC 1 [facility] None  01/13/2019 11:45 AM [facility] LINAC 1 [facility] None  01/16/2019  9:15 AM [facility] LINAC 1 [facility] None  01/16/2019  9:30 AM [facility] LAB ONLY [facility] None  01/16/2019 10:00 AM Heilingoetter, Cassandra L, PA-C [facility] None  01/17/2019  8:30 AM [facility] INFUSION [facility] None  01/17/2019 11:45 AM [facility] LINAC 1 [facility] None  01/18/2019 11:45 AM  [facility] LINAC 1 [facility] None  01/19/2019  9:45 AM [facility] LINAC 1 [facility] None  01/20/2019  9:45 AM [facility] LINAC 1 [facility] None  01/23/2019  9:00 AM [facility] LAB 3 [facility] None  01/23/2019  9:45 AM [facility] LINAC 1 [facility] None  01/23/2019 10:00 AM [facility] INFUSION [facility] None  01/24/2019  9:45 AM [facility] LINAC 1 [facility] None  01/25/2019  9:45 AM [facility] LINAC 1 [facility] None  01/26/2019  9:45 AM [facility] LINAC 1 [facility] None  01/27/2019  9:45 AM [facility] LINAC 1 [facility] None  01/30/2019  8:15 AM [facility] LAB ONLY [facility] None  01/30/2019  8:45 AM [name], MD [facility] None  01/30/2019  9:30 AM [facility] INFUSION [facility] None  01/30/2019  9:45 AM [facility] LINAC 1 [facility] None  01/31/2019  9:45 AM [facility] LINAC 1 [facility] None  02/01/2019  9:45 AM [facility] LINAC 1 [facility] None  02/02/2019  9:45 AM [facility] LINAC 1 [facility] None  02/03/2019  9:45 AM [facility] LINAC 1 [facility] None  02/06/2019  9:15 AM [facility] LAB 4 [facility] None  02/06/2019  9:45 AM [facility] LINAC 1 [facility] None  02/06/2019 10:30 AM [facility] INFUSION [facility] None    No orders of the defined types were placed in this encounter.      Subjective:   Patient ID:  Nathan [name] is a 58 y.o. (DOB Jun 09, 1961) male.  Chief Complaint: No chief complaint on file.   HPI Nathan [name]   is a 58 year old male who has a history of a stage IIIa non-small cell lung cancer.  He is managed by Dr. Julien Nordmann and is status post cycle  1, day 1 of chemotherapy which was dosed on 12/26/2018.  He presents to the clinic today for consideration of cycle 2, day 1 of carboplatin and paclitaxel chemotherapy.  He is scheduled to begin concurrent radiation therapy tomorrow.  He stated that he tolerated his chemotherapy well last week with only mild nausea without vomiting.  He additionally had some mild diarrhea.  He has a  prescription for antiemetics at home and states that he plans to use Imodium should he have diarrhea with the cycle of chemotherapy.  He otherwise has no issues of concern today.  Medications: I have reviewed the patient's current medications.  Allergies: No Known Allergies  Past Medical History:  Diagnosis Date  . Arthritis    dengenerative joint surgery-  R hip & L hip  . Family history of adverse reaction to anesthesia    PAternal Margot Chimes- age 102's  "died due to too much anesthesia"  . History of kidney stones    laser surgery    Past Surgical History:  Procedure Laterality Date  . HAND SURGERY Right 2008   thumb with pin  . LITHOTRIPSY    . 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 TOTAL HIP ARTHROPLASTY Left 11/29/2017   Procedure: LEFT TOTAL HIP ARTHROPLASTY ANTERIOR APPROACH;  Surgeon: Rod Can, MD;  Location: [place];  Service: Orthopedics;  Laterality: Left;  Needs RNFA    Family History  Problem Relation Age of Onset  . Cancer - Colon Mother 28  . Anemia Father 55    Social History   Socioeconomic History  . Marital status: Single    Spouse name: Not on file  . Number of children: Not on file  . Years of education: Not on file  . Highest education level: Not on file  Occupational History  . Occupation: Arboriculturist  . Financial resource strain: Not on file  . Food insecurity:    Worry: Not on file    Inability: Not on file  . Transportation needs:    Medical: Not on file    Non-medical: Not on file  Tobacco Use  . Smoking status: Light Tobacco Smoker    Years: 20.00  . Smokeless tobacco: Never Used  . Tobacco comment: "I may smoke 1cigs a day or not."  Substance and Sexual Activity  . Alcohol use: Yes    Alcohol/week: 12.0 standard drinks    Types: 12 Cans of beer per week    Comment:  1 pint a week of Liquor  . Drug use: Yes    Frequency: 14.0 times per week    Types: Marijuana    Comment: 2 joints per day- last time this am  . Sexual activity: Not on file  Lifestyle  . Physical activity:    Days per week: Not on file    Minutes per session: Not on file  . Stress: Not on file  Relationships  . Social connections:    Talks on phone: Not on file    Gets together: Not on file    Attends religious service: Not on file    Active member of club or organization: Not on file    Attends meetings of clubs or organizations: Not on file    Relationship status: Not on file  . Intimate partner violence:    Fear of current or ex partner: Not  on file    Emotionally abused: Not on file    Physically abused: Not on file    Forced sexual activity: Not on file  Other Topics Concern  . Not on file  Social History Narrative   Pt lives with fiance and 2 children.    Past Medical History, Surgical history, Social history, and Family history were reviewed and updated as appropriate.   Please see review of systems for further details on the patient's review from today.   Review of Systems:  Review of Systems  Constitutional: Negative for chills, diaphoresis and fever.  HENT: Negative for trouble swallowing and voice change.   Respiratory: Negative for cough, chest tightness, shortness of breath and wheezing.   Cardiovascular: Negative for chest pain and palpitations.  Gastrointestinal: Positive for diarrhea and nausea. Negative for abdominal pain, constipation and vomiting.  Musculoskeletal: Negative for back pain and myalgias.  Neurological: Negative for dizziness, light-headedness and headaches.    Objective:   Physical Exam:  There were no vitals taken for this visit. ECOG: 0  Physical Exam Constitutional:      General: He is not in acute distress.    Appearance: He is not diaphoretic.  HENT:     Head: Normocephalic and atraumatic.  Eyes:     General: No scleral  icterus.       Right eye: No discharge.        Left eye: No discharge.     Conjunctiva/sclera: Conjunctivae normal.  Cardiovascular:     Rate and Rhythm: Normal rate and regular rhythm.     Heart sounds: Normal heart sounds. No murmur. No friction rub. No gallop.   Pulmonary:     Effort: Pulmonary effort is normal. No respiratory distress.     Breath sounds: Normal breath sounds. No wheezing or rales.  Skin:    General: Skin is warm and dry.     Findings: No erythema or rash.  Neurological:     Mental Status: He is alert.     Lab Review:     Component Value Date/Time   NA 138 01/02/2019 0845   K 3.8 01/02/2019 0845   CL 104 01/02/2019 0845   CO2 25 01/02/2019 0845   GLUCOSE 115 (H) 01/02/2019 0845   BUN 14 01/02/2019 0845   CREATININE 0.76 01/02/2019 0845   CALCIUM 9.5 01/02/2019 0845   PROT 7.3 01/02/2019 0845   ALBUMIN 4.0 01/02/2019 0845   AST 24 01/02/2019 0845   ALT 35 01/02/2019 0845   ALKPHOS 103 01/02/2019 0845   BILITOT 0.8 01/02/2019 0845   GFRNONAA >60 01/02/2019 0845   GFRAA >60 01/02/2019 0845       Component Value Date/Time   WBC 11.0 (H) 01/02/2019 0845   WBC 12.5 (H) 11/30/2017 0908   RBC 4.53 01/02/2019 0845   HGB 14.3 01/02/2019 0845   HCT 42.1 01/02/2019 0845   PLT 208 01/02/2019 0845   MCV 92.9 01/02/2019 0845   MCH 31.6 01/02/2019 0845   MCHC 34.0 01/02/2019 0845   RDW 12.2 01/02/2019 0845   LYMPHSABS 2.4 01/02/2019 0845   MONOABS 0.9 01/02/2019 0845   EOSABS 0.3 01/02/2019 0845   BASOSABS 0.1 01/02/2019 0845   -------------------------------  Imaging from last 24 hours (if applicable):  Radiology interpretation: No results found.    OK to treat. Sandi Mealy, MHS, PA-C  This case was discussed with Dr. Julien Nordmann. He expressed agreement with my management of this patient.

## 2019-01-02 NOTE — Patient Instructions (Signed)
[facility] Discharge Instructions for Patients Receiving Chemotherapy  Today you received the following chemotherapy agents: Paclitaxel (Taxol) and Carboplatin (Paraplatin)  To help prevent nausea and vomiting after your treatment, we encourage you to take your nausea medication as directed.    If you develop nausea and vomiting that is not controlled by your nausea medication, call the clinic.   BELOW ARE SYMPTOMS THAT SHOULD BE REPORTED IMMEDIATELY:  *FEVER GREATER THAN 100.5 F  *CHILLS WITH OR WITHOUT FEVER  NAUSEA AND VOMITING THAT IS NOT CONTROLLED WITH YOUR NAUSEA MEDICATION  *UNUSUAL SHORTNESS OF BREATH  *UNUSUAL BRUISING OR BLEEDING  TENDERNESS IN MOUTH AND THROAT WITH OR WITHOUT PRESENCE OF ULCERS  *URINARY PROBLEMS  *BOWEL PROBLEMS  UNUSUAL RASH Items with * indicate a potential emergency and should be followed up as soon as possible.  Feel free to call the clinic should you have any questions or concerns. The clinic phone number is [phone].  Please show the [place] at check-in to the Emergency Department and triage nurse.  Paclitaxel injection What is this medicine? PACLITAXEL (PAK li TAX el) is a chemotherapy drug. It targets fast dividing cells, like cancer cells, and causes these cells to die. This medicine is used to treat ovarian cancer, breast cancer, lung cancer, Kaposi's sarcoma, and other cancers. This medicine may be used for other purposes; ask your health care provider or pharmacist if you have questions. COMMON BRAND NAME(S): Onxol, Taxol What should I tell my health care provider before I take this medicine? They need to know if you have any of these conditions: -history of irregular heartbeat -liver disease -low blood counts, like low white cell, platelet, or red cell counts -lung or breathing disease, like asthma -tingling of the fingers or toes, or other nerve disorder -an unusual or allergic reaction to  paclitaxel, alcohol, polyoxyethylated castor oil, other chemotherapy, other medicines, foods, dyes, or preservatives -pregnant or trying to get pregnant -breast-feeding How should I use this medicine? This drug is given as an infusion into a vein. It is administered in a hospital or clinic by a specially trained health care professional. Talk to your pediatrician regarding the use of this medicine in children. Special care may be needed. Overdosage: If you think you have taken too much of this medicine contact a poison control center or emergency room at once. NOTE: This medicine is only for you. Do not share this medicine with others. What if I miss a dose? It is important not to miss your dose. Call your doctor or health care professional if you are unable to keep an appointment. What may interact with this medicine? Do not take this medicine with any of the following medications: -disulfiram -metronidazole This medicine may also interact with the following medications: -antiviral medicines for hepatitis, HIV or AIDS -certain antibiotics like erythromycin and clarithromycin -certain medicines for fungal infections like ketoconazole and itraconazole -certain medicines for seizures like carbamazepine, phenobarbital, phenytoin -gemfibrozil -nefazodone -rifampin -St. John's wort This list may not describe all possible interactions. Give your health care provider a list of all the medicines, herbs, non-prescription drugs, or dietary supplements you use. Also tell them if you smoke, drink alcohol, or use illegal drugs. Some items may interact with your medicine. What should I watch for while using this medicine? Your condition will be monitored carefully while you are receiving this medicine. You will need important blood work done while you are taking this medicine. This medicine can cause serious allergic  reactions. To reduce your risk you will need to take other medicine(s) before treatment  with this medicine. If you experience allergic reactions like skin rash, itching or hives, swelling of the face, lips, or tongue, tell your doctor or health care professional right away. In some cases, you may be given additional medicines to help with side effects. Follow all directions for their use. This drug may make you feel generally unwell. This is not uncommon, as chemotherapy can affect healthy cells as well as cancer cells. Report any side effects. Continue your course of treatment even though you feel ill unless your doctor tells you to stop. Call your doctor or health care professional for advice if you get a fever, chills or sore throat, or other symptoms of a cold or flu. Do not treat yourself. This drug decreases your body's ability to fight infections. Try to avoid being around people who are sick. This medicine may increase your risk to bruise or bleed. Call your doctor or health care professional if you notice any unusual bleeding. Be careful brushing and flossing your teeth or using a toothpick because you may get an infection or bleed more easily. If you have any dental work done, tell your dentist you are receiving this medicine. Avoid taking products that contain aspirin, acetaminophen, ibuprofen, naproxen, or ketoprofen unless instructed by your doctor. These medicines may hide a fever. Do not become pregnant while taking this medicine. Women should inform their doctor if they wish to become pregnant or think they might be pregnant. There is a potential for serious side effects to an unborn child. Talk to your health care professional or pharmacist for more information. Do not breast-feed an infant while taking this medicine. Men are advised not to father a child while receiving this medicine. This product may contain alcohol. Ask your pharmacist or healthcare provider if this medicine contains alcohol. Be sure to tell all healthcare providers you are taking this medicine. Certain  medicines, like metronidazole and disulfiram, can cause an unpleasant reaction when taken with alcohol. The reaction includes flushing, headache, nausea, vomiting, sweating, and increased thirst. The reaction can last from 30 minutes to several hours. What side effects may I notice from receiving this medicine? Side effects that you should report to your doctor or health care professional as soon as possible: -allergic reactions like skin rash, itching or hives, swelling of the face, lips, or tongue -breathing problems -changes in vision -fast, irregular heartbeat -high or low blood pressure -mouth sores -pain, tingling, numbness in the hands or feet -signs of decreased platelets or bleeding - bruising, pinpoint red spots on the skin, black, tarry stools, blood in the urine -signs of decreased red blood cells - unusually weak or tired, feeling faint or lightheaded, falls -signs of infection - fever or chills, cough, sore throat, pain or difficulty passing urine -signs and symptoms of liver injury like dark yellow or brown urine; general ill feeling or flu-like symptoms; light-colored stools; loss of appetite; nausea; right upper belly pain; unusually weak or tired; yellowing of the eyes or skin -swelling of the ankles, feet, hands -unusually slow heartbeat Side effects that usually do not require medical attention (report to your doctor or health care professional if they continue or are bothersome): -diarrhea -hair loss -loss of appetite -muscle or joint pain -nausea, vomiting -pain, redness, or irritation at site where injected -tiredness This list may not describe all possible side effects. Call your doctor for medical advice about side effects. You may  report side effects to FDA at 1-800-FDA-1088. Where should I keep my medicine? This drug is given in a hospital or clinic and will not be stored at home. NOTE: This sheet is a summary. It may not cover all possible information. If you  have questions about this medicine, talk to your doctor, pharmacist, or health care provider.  2019 Elsevier/Gold Standard (2017-07-13 13:14:55)  Carboplatin injection What is this medicine? CARBOPLATIN (KAR boe pla tin) is a chemotherapy drug. It targets fast dividing cells, like cancer cells, and causes these cells to die. This medicine is used to treat ovarian cancer and many other cancers. This medicine may be used for other purposes; ask your health care provider or pharmacist if you have questions. COMMON BRAND NAME(S): Paraplatin What should I tell my health care provider before I take this medicine? They need to know if you have any of these conditions: -blood disorders -hearing problems -kidney disease -recent or ongoing radiation therapy -an unusual or allergic reaction to carboplatin, cisplatin, other chemotherapy, other medicines, foods, dyes, or preservatives -pregnant or trying to get pregnant -breast-feeding How should I use this medicine? This drug is usually given as an infusion into a vein. It is administered in a hospital or clinic by a specially trained health care professional. Talk to your pediatrician regarding the use of this medicine in children. Special care may be needed. Overdosage: If you think you have taken too much of this medicine contact a poison control center or emergency room at once. NOTE: This medicine is only for you. Do not share this medicine with others. What if I miss a dose? It is important not to miss a dose. Call your doctor or health care professional if you are unable to keep an appointment. What may interact with this medicine? -medicines for seizures -medicines to increase blood counts like filgrastim, pegfilgrastim, sargramostim -some antibiotics like amikacin, gentamicin, neomycin, streptomycin, tobramycin -vaccines Talk to your doctor or health care professional before taking any of these  medicines: -acetaminophen -aspirin -ibuprofen -ketoprofen -naproxen This list may not describe all possible interactions. Give your health care provider a list of all the medicines, herbs, non-prescription drugs, or dietary supplements you use. Also tell them if you smoke, drink alcohol, or use illegal drugs. Some items may interact with your medicine. What should I watch for while using this medicine? Your condition will be monitored carefully while you are receiving this medicine. You will need important blood work done while you are taking this medicine. This drug may make you feel generally unwell. This is not uncommon, as chemotherapy can affect healthy cells as well as cancer cells. Report any side effects. Continue your course of treatment even though you feel ill unless your doctor tells you to stop. In some cases, you may be given additional medicines to help with side effects. Follow all directions for their use. Call your doctor or health care professional for advice if you get a fever, chills or sore throat, or other symptoms of a cold or flu. Do not treat yourself. This drug decreases your body's ability to fight infections. Try to avoid being around people who are sick. This medicine may increase your risk to bruise or bleed. Call your doctor or health care professional if you notice any unusual bleeding. Be careful brushing and flossing your teeth or using a toothpick because you may get an infection or bleed more easily. If you have any dental work done, tell your dentist you are receiving this medicine.  Avoid taking products that contain aspirin, acetaminophen, ibuprofen, naproxen, or ketoprofen unless instructed by your doctor. These medicines may hide a fever. Do not become pregnant while taking this medicine. Women should inform their doctor if they wish to become pregnant or think they might be pregnant. There is a potential for serious side effects to an unborn child. Talk to  your health care professional or pharmacist for more information. Do not breast-feed an infant while taking this medicine. What side effects may I notice from receiving this medicine? Side effects that you should report to your doctor or health care professional as soon as possible: -allergic reactions like skin rash, itching or hives, swelling of the face, lips, or tongue -signs of infection - fever or chills, cough, sore throat, pain or difficulty passing urine -signs of decreased platelets or bleeding - bruising, pinpoint red spots on the skin, black, tarry stools, nosebleeds -signs of decreased red blood cells - unusually weak or tired, fainting spells, lightheadedness -breathing problems -changes in hearing -changes in vision -chest pain -high blood pressure -low blood counts - This drug may decrease the number of white blood cells, red blood cells and platelets. You may be at increased risk for infections and bleeding. -nausea and vomiting -pain, swelling, redness or irritation at the injection site -pain, tingling, numbness in the hands or feet -problems with balance, talking, walking -trouble passing urine or change in the amount of urine Side effects that usually do not require medical attention (report to your doctor or health care professional if they continue or are bothersome): -hair loss -loss of appetite -metallic taste in the mouth or changes in taste This list may not describe all possible side effects. Call your doctor for medical advice about side effects. You may report side effects to FDA at 1-800-FDA-1088. Where should I keep my medicine? This drug is given in a hospital or clinic and will not be stored at home. NOTE: This sheet is a summary. It may not cover all possible information. If you have questions about this medicine, talk to your doctor, pharmacist, or health care provider.  2019 Elsevier/Gold Standard (2008-02-14 14:38:05)

## 2019-01-03 ENCOUNTER — Ambulatory Visit
Admission: RE | Admit: 2019-01-03 | Discharge: 2019-01-03 | Disposition: A | Discharge status: Home / Self Care | Payer: No Typology Code available for payment source | Source: Ambulatory Visit | Attending: Radiation Oncology | Admitting: Radiation Oncology

## 2019-01-03 DIAGNOSIS — Z5111 Encounter for antineoplastic chemotherapy: Secondary | ICD-10-CM | POA: Diagnosis not present

## 2019-01-03 DIAGNOSIS — C3491 Malignant neoplasm of unspecified part of right bronchus or lung: Secondary | ICD-10-CM

## 2019-01-03 MED ORDER — SONAFINE EX EMUL
1.0000 "application " | Freq: Two times a day (BID) | CUTANEOUS | Status: DC
  Administered 2019-01-03: 1 via TOPICAL

## 2019-01-03 NOTE — Progress Notes (Signed)
  Radiation Oncology         [phone] ________________________________  Name: Nathan [name] MRN: [id number]  Date: 01/03/2019  DOB: 01/24/1961  Simulation Verification Note    ICD-10-CM   1. Non-small cell carcinoma of right lung, stage 3 (HCC) C34.91 SONAFINE emulsion 1 application    Status: outpatient  NARRATIVE: The patient was brought to the treatment unit and placed in the planned treatment position. The clinical setup was verified. Then port films were obtained and uploaded to the radiation oncology medical record software.  The treatment beams were carefully compared against the planned radiation fields. The position location and shape of the radiation fields was reviewed. They targeted volume of tissue appears to be appropriately covered by the radiation beams. Organs at risk appear to be excluded as planned.  Based on my personal review, I approved the simulation verification. The patient's treatment will proceed as planned.  -----------------------------------  Blair Promise, PhD, MD

## 2019-01-04 ENCOUNTER — Telehealth: Payer: Self-pay | Admitting: Medical Oncology

## 2019-01-04 ENCOUNTER — Ambulatory Visit
Admission: RE | Admit: 2019-01-04 | Discharge: 2019-01-04 | Disposition: A | Discharge status: Home / Self Care | Payer: No Typology Code available for payment source | Source: Ambulatory Visit | Attending: Radiation Oncology | Admitting: Radiation Oncology

## 2019-01-04 DIAGNOSIS — Z5111 Encounter for antineoplastic chemotherapy: Secondary | ICD-10-CM | POA: Diagnosis not present

## 2019-01-04 NOTE — Telephone Encounter (Signed)
cancer care application- mailed back to pt with RN signature.

## 2019-01-05 ENCOUNTER — Ambulatory Visit
Admission: RE | Admit: 2019-01-05 | Discharge: 2019-01-05 | Disposition: A | Discharge status: Home / Self Care | Payer: No Typology Code available for payment source | Source: Ambulatory Visit | Attending: Radiation Oncology | Admitting: Radiation Oncology

## 2019-01-05 DIAGNOSIS — Z5111 Encounter for antineoplastic chemotherapy: Secondary | ICD-10-CM | POA: Diagnosis not present

## 2019-01-06 ENCOUNTER — Other Ambulatory Visit: Payer: Self-pay | Admitting: Medical Oncology

## 2019-01-06 ENCOUNTER — Ambulatory Visit
Admission: RE | Admit: 2019-01-06 | Discharge: 2019-01-06 | Disposition: A | Discharge status: Home / Self Care | Payer: No Typology Code available for payment source | Source: Ambulatory Visit | Attending: Radiation Oncology | Admitting: Radiation Oncology

## 2019-01-06 DIAGNOSIS — Z5111 Encounter for antineoplastic chemotherapy: Secondary | ICD-10-CM | POA: Diagnosis not present

## 2019-01-06 DIAGNOSIS — C3491 Malignant neoplasm of unspecified part of right bronchus or lung: Secondary | ICD-10-CM

## 2019-01-09 ENCOUNTER — Ambulatory Visit: Payer: No Typology Code available for payment source

## 2019-01-09 ENCOUNTER — Other Ambulatory Visit: Payer: Self-pay | Admitting: Medical

## 2019-01-09 ENCOUNTER — Inpatient Hospital Stay: Payer: No Typology Code available for payment source

## 2019-01-09 ENCOUNTER — Ambulatory Visit
Admission: RE | Admit: 2019-01-09 | Discharge: 2019-01-09 | Disposition: A | Discharge status: Home / Self Care | Payer: No Typology Code available for payment source | Source: Ambulatory Visit | Attending: Radiation Oncology | Admitting: Radiation Oncology

## 2019-01-09 ENCOUNTER — Other Ambulatory Visit: Payer: No Typology Code available for payment source

## 2019-01-09 ENCOUNTER — Inpatient Hospital Stay (HOSPITAL_BASED_OUTPATIENT_CLINIC_OR_DEPARTMENT_OTHER): Payer: No Typology Code available for payment source | Admitting: Medical

## 2019-01-09 VITALS — BP 129/74 | HR 69 | Temp 98.6°F | Resp 16

## 2019-01-09 DIAGNOSIS — Z5111 Encounter for antineoplastic chemotherapy: Secondary | ICD-10-CM | POA: Diagnosis not present

## 2019-01-09 DIAGNOSIS — C3491 Malignant neoplasm of unspecified part of right bronchus or lung: Secondary | ICD-10-CM

## 2019-01-09 DIAGNOSIS — M62838 Other muscle spasm: Secondary | ICD-10-CM

## 2019-01-09 LAB — CMP (CANCER CENTER ONLY)
ALT: 40 U/L (ref 0–44)
AST: 22 U/L (ref 15–41)
Albumin: 3.9 g/dL (ref 3.5–5.0)
Alkaline Phosphatase: 104 U/L (ref 38–126)
Anion gap: 10 (ref 5–15)
BUN: 16 mg/dL (ref 6–20)
CO2: 24 mmol/L (ref 22–32)
Calcium: 9.3 mg/dL (ref 8.9–10.3)
Chloride: 104 mmol/L (ref 98–111)
Creatinine: 0.83 mg/dL (ref 0.61–1.24)
GFR, Est AFR Am: >60 mL/min (ref >60)
Glucose, Bld: 107 mg/dL — ABNORMAL HIGH (ref 70–99)
Potassium: 4.3 mmol/L (ref 3.5–5.1)
Sodium: 138 mmol/L (ref 135–145)
Total Bilirubin: 0.6 mg/dL (ref 0.3–1.2)
Total Protein: 7 g/dL (ref 6.5–8.1)

## 2019-01-09 LAB — CBC WITH DIFFERENTIAL (CANCER CENTER ONLY)
ABS IMMATURE GRANULOCYTES: 0.14 10*3/uL — AB (ref 0.00–0.07)
BASOS PCT: 1 %
Basophils Absolute: 0 10*3/uL (ref 0.0–0.1)
Eosinophils Absolute: 0.2 10*3/uL (ref 0.0–0.5)
Eosinophils Relative: 3 %
HCT: 40.6 % (ref 39.0–52.0)
Hemoglobin: 13.4 g/dL (ref 13.0–17.0)
Immature Granulocytes: 2 %
Lymphocytes Relative: 13 %
Lymphs Abs: 1.1 10*3/uL (ref 0.7–4.0)
MCH: 31.1 pg (ref 26.0–34.0)
MCHC: 33 g/dL (ref 30.0–36.0)
MCV: 94.2 fL (ref 80.0–100.0)
Monocytes Absolute: 0.7 10*3/uL (ref 0.1–1.0)
Monocytes Relative: 8 %
Neutro Abs: 6.4 10*3/uL (ref 1.7–7.7)
Neutrophils Relative %: 73 %
Platelet Count: 192 10*3/uL (ref 150–400)
RBC: 4.31 MIL/uL (ref 4.22–5.81)
RDW: 12.5 % (ref 11.5–15.5)
WBC Count: 8.5 10*3/uL (ref 4.0–10.5)
nRBC: 0 % (ref 0.0–0.2)

## 2019-01-09 MED ORDER — METHOCARBAMOL 500 MG PO TABS: 500.0000 mg | ORAL_TABLET | Freq: Four times a day (QID) | ORAL | 0 refills | Status: DC

## 2019-01-09 MED ORDER — PALONOSETRON HCL INJECTION 0.25 MG/5ML
INTRAVENOUS | Status: AC
  Filled 2019-01-09: qty 5

## 2019-01-09 MED ORDER — PALONOSETRON HCL INJECTION 0.25 MG/5ML
0.2500 mg | Freq: Once | INTRAVENOUS | Status: AC
  Administered 2019-01-09: 0.25 mg via INTRAVENOUS

## 2019-01-09 MED ORDER — SODIUM CHLORIDE 0.9 % IV SOLN
262.2000 mg | Freq: Once | INTRAVENOUS | Status: AC
  Administered 2019-01-09: 260 mg via INTRAVENOUS
  Filled 2019-01-09: qty 26

## 2019-01-09 MED ORDER — FAMOTIDINE IN NACL 20-0.9 MG/50ML-% IV SOLN
20.0000 mg | Freq: Once | INTRAVENOUS | Status: AC
  Administered 2019-01-09: 20 mg via INTRAVENOUS

## 2019-01-09 MED ORDER — SODIUM CHLORIDE 0.9 % IV SOLN
20.0000 mg | Freq: Once | INTRAVENOUS | Status: AC
  Administered 2019-01-09: 20 mg via INTRAVENOUS
  Filled 2019-01-09: qty 20

## 2019-01-09 MED ORDER — DIPHENHYDRAMINE HCL 50 MG/ML IJ SOLN
INTRAMUSCULAR | Status: AC
  Filled 2019-01-09: qty 1

## 2019-01-09 MED ORDER — DIPHENHYDRAMINE HCL 50 MG/ML IJ SOLN
50.0000 mg | Freq: Once | INTRAMUSCULAR | Status: AC
  Administered 2019-01-09: 50 mg via INTRAVENOUS

## 2019-01-09 MED ORDER — SODIUM CHLORIDE 0.9 % IV SOLN
Freq: Once | INTRAVENOUS | Status: AC
  Administered 2019-01-09: 09:00:00 via INTRAVENOUS
  Filled 2019-01-09: qty 250

## 2019-01-09 MED ORDER — FAMOTIDINE IN NACL 20-0.9 MG/50ML-% IV SOLN
INTRAVENOUS | Status: AC
  Filled 2019-01-09: qty 50

## 2019-01-09 MED ORDER — SODIUM CHLORIDE 0.9 % IV SOLN
45.0000 mg/m2 | Freq: Once | INTRAVENOUS | Status: AC
  Administered 2019-01-09: 84 mg via INTRAVENOUS
  Filled 2019-01-09: qty 14

## 2019-01-09 NOTE — Progress Notes (Signed)
Mr. Nathan [name] was seen in the infusion room today.  He reports that he has been having pain in his left distal triceps since beginning his chemotherapy treatment.  He denies any trauma or changes in activity.  A brief exam was conducted today.  The patient is full range of motion.  There is an area of induration in the muscles in the left distal triceps which is consistent with a muscle spasm.  A prescription for Robaxin was faxed to his pharmacy.  Sandi Mealy, MHS, PA-C Physician Assistant

## 2019-01-09 NOTE — Patient Instructions (Signed)
   [facility] Discharge Instructions for Patients Receiving Chemotherapy  Today you received the following chemotherapy agents Taxol and Carboplatin   To help prevent nausea and vomiting after your treatment, we encourage you to take your nausea medication as directed.    If you develop nausea and vomiting that is not controlled by your nausea medication, call the clinic.   BELOW ARE SYMPTOMS THAT SHOULD BE REPORTED IMMEDIATELY:  *FEVER GREATER THAN 100.5 F  *CHILLS WITH OR WITHOUT FEVER  NAUSEA AND VOMITING THAT IS NOT CONTROLLED WITH YOUR NAUSEA MEDICATION  *UNUSUAL SHORTNESS OF BREATH  *UNUSUAL BRUISING OR BLEEDING  TENDERNESS IN MOUTH AND THROAT WITH OR WITHOUT PRESENCE OF ULCERS  *URINARY PROBLEMS  *BOWEL PROBLEMS  UNUSUAL RASH Items with * indicate a potential emergency and should be followed up as soon as possible.  Feel free to call the clinic should you have any questions or concerns. The clinic phone number is [phone].  Please show the CHEMO ALERT CARD at check-in to the Emergency Department and triage nurse.   

## 2019-01-10 ENCOUNTER — Ambulatory Visit
Admission: RE | Admit: 2019-01-10 | Discharge: 2019-01-10 | Disposition: A | Discharge status: Home / Self Care | Payer: No Typology Code available for payment source | Source: Ambulatory Visit | Attending: Radiation Oncology | Admitting: Radiation Oncology

## 2019-01-10 DIAGNOSIS — Z5111 Encounter for antineoplastic chemotherapy: Secondary | ICD-10-CM | POA: Diagnosis not present

## 2019-01-11 ENCOUNTER — Other Ambulatory Visit: Payer: Self-pay | Admitting: Radiation Oncology

## 2019-01-11 ENCOUNTER — Ambulatory Visit
Admission: RE | Admit: 2019-01-11 | Discharge: 2019-01-11 | Disposition: A | Discharge status: Home / Self Care | Payer: No Typology Code available for payment source | Source: Ambulatory Visit | Attending: Radiation Oncology | Admitting: Radiation Oncology

## 2019-01-11 DIAGNOSIS — Z5111 Encounter for antineoplastic chemotherapy: Secondary | ICD-10-CM | POA: Diagnosis not present

## 2019-01-11 MED ORDER — SUCRALFATE 1 G PO TABS: 1.0000 g | ORAL_TABLET | Freq: Three times a day (TID) | ORAL | 0 refills | Status: DC

## 2019-01-12 ENCOUNTER — Encounter: Payer: Self-pay | Admitting: Internal Medicine

## 2019-01-12 ENCOUNTER — Ambulatory Visit
Admission: RE | Admit: 2019-01-12 | Discharge: 2019-01-12 | Disposition: A | Discharge status: Home / Self Care | Payer: No Typology Code available for payment source | Source: Ambulatory Visit | Attending: Radiation Oncology | Admitting: Radiation Oncology

## 2019-01-12 DIAGNOSIS — Z5111 Encounter for antineoplastic chemotherapy: Secondary | ICD-10-CM | POA: Diagnosis not present

## 2019-01-12 NOTE — Progress Notes (Signed)
Returned call from voicemail left by patient regarding grant.  Introduced myself as Arboriculturist and advised what is needed to apply. He verbalized understanding and states he can bring today for his Radiation appointment.  He has my card for any additional financial questions or concerns.

## 2019-01-12 NOTE — Progress Notes (Signed)
Patient came in to bring proof of income for one-time $700 [place].  Patient approved. Copy of the approval letter as well as expense sheet along with Outpatient pharmacy information was given. He received gas card today.  He has my card for any additional financial questions or concerns.

## 2019-01-13 ENCOUNTER — Ambulatory Visit
Admission: RE | Admit: 2019-01-13 | Discharge: 2019-01-13 | Disposition: A | Discharge status: Home / Self Care | Payer: No Typology Code available for payment source | Source: Ambulatory Visit | Attending: Radiation Oncology | Admitting: Radiation Oncology

## 2019-01-13 ENCOUNTER — Other Ambulatory Visit: Payer: Self-pay | Admitting: Physician Assistant

## 2019-01-13 DIAGNOSIS — Z5111 Encounter for antineoplastic chemotherapy: Secondary | ICD-10-CM | POA: Diagnosis not present

## 2019-01-13 DIAGNOSIS — C3491 Malignant neoplasm of unspecified part of right bronchus or lung: Secondary | ICD-10-CM

## 2019-01-16 ENCOUNTER — Telehealth: Payer: Self-pay | Admitting: Physician Assistant

## 2019-01-16 ENCOUNTER — Encounter: Payer: Self-pay | Admitting: Physician Assistant

## 2019-01-16 ENCOUNTER — Inpatient Hospital Stay: Payer: No Typology Code available for payment source

## 2019-01-16 ENCOUNTER — Inpatient Hospital Stay (HOSPITAL_BASED_OUTPATIENT_CLINIC_OR_DEPARTMENT_OTHER): Payer: No Typology Code available for payment source | Admitting: Physician Assistant

## 2019-01-16 ENCOUNTER — Ambulatory Visit
Admission: RE | Admit: 2019-01-16 | Discharge: 2019-01-16 | Disposition: A | Discharge status: Home / Self Care | Payer: No Typology Code available for payment source | Source: Ambulatory Visit | Attending: Radiation Oncology | Admitting: Radiation Oncology

## 2019-01-16 ENCOUNTER — Other Ambulatory Visit: Payer: Self-pay

## 2019-01-16 VITALS — BP 126/86 | HR 98 | Temp 97.6°F | Resp 18 | Ht 71.0 in | Wt 165.4 lb

## 2019-01-16 DIAGNOSIS — C3491 Malignant neoplasm of unspecified part of right bronchus or lung: Secondary | ICD-10-CM

## 2019-01-16 DIAGNOSIS — M1990 Unspecified osteoarthritis, unspecified site: Secondary | ICD-10-CM | POA: Diagnosis not present

## 2019-01-16 DIAGNOSIS — Z7982 Long term (current) use of aspirin: Secondary | ICD-10-CM

## 2019-01-16 DIAGNOSIS — Z5111 Encounter for antineoplastic chemotherapy: Secondary | ICD-10-CM

## 2019-01-16 DIAGNOSIS — C3431 Malignant neoplasm of lower lobe, right bronchus or lung: Secondary | ICD-10-CM

## 2019-01-16 DIAGNOSIS — Z79899 Other long term (current) drug therapy: Secondary | ICD-10-CM

## 2019-01-16 DIAGNOSIS — Z791 Long term (current) use of non-steroidal anti-inflammatories (NSAID): Secondary | ICD-10-CM

## 2019-01-16 DIAGNOSIS — F17210 Nicotine dependence, cigarettes, uncomplicated: Secondary | ICD-10-CM | POA: Diagnosis not present

## 2019-01-16 DIAGNOSIS — R12 Heartburn: Secondary | ICD-10-CM

## 2019-01-16 LAB — CMP (CANCER CENTER ONLY)
ALT: 34 U/L (ref 0–44)
AST: 19 U/L (ref 15–41)
Albumin: 4.1 g/dL (ref 3.5–5.0)
Alkaline Phosphatase: 91 U/L (ref 38–126)
Anion gap: 8 (ref 5–15)
BUN: 12 mg/dL (ref 6–20)
CO2: 26 mmol/L (ref 22–32)
Calcium: 9.6 mg/dL (ref 8.9–10.3)
Chloride: 103 mmol/L (ref 98–111)
Creatinine: 0.81 mg/dL (ref 0.61–1.24)
GFR, Est AFR Am: >60 mL/min (ref >60)
GFR, Estimated: >60 mL/min (ref >60)
Glucose, Bld: 114 mg/dL — ABNORMAL HIGH (ref 70–99)
Potassium: 4.1 mmol/L (ref 3.5–5.1)
Sodium: 137 mmol/L (ref 135–145)
Total Bilirubin: 0.7 mg/dL (ref 0.3–1.2)
Total Protein: 7.5 g/dL (ref 6.5–8.1)

## 2019-01-16 LAB — CBC WITH DIFFERENTIAL (CANCER CENTER ONLY)
ABS IMMATURE GRANULOCYTES: 0.14 10*3/uL — AB (ref 0.00–0.07)
BASOS PCT: 1 %
Basophils Absolute: 0.1 10*3/uL (ref 0.0–0.1)
EOS ABS: 0.2 10*3/uL (ref 0.0–0.5)
Eosinophils Relative: 2 %
HCT: 42.6 % (ref 39.0–52.0)
Hemoglobin: 14.2 g/dL (ref 13.0–17.0)
IMMATURE GRANULOCYTES: 2 %
Lymphocytes Relative: 16 %
Lymphs Abs: 1.4 10*3/uL (ref 0.7–4.0)
MCH: 31.1 pg (ref 26.0–34.0)
MCHC: 33.3 g/dL (ref 30.0–36.0)
MCV: 93.2 fL (ref 80.0–100.0)
Monocytes Absolute: 0.7 10*3/uL (ref 0.1–1.0)
Monocytes Relative: 8 %
Neutro Abs: 6 10*3/uL (ref 1.7–7.7)
Neutrophils Relative %: 71 %
PLATELETS: 174 10*3/uL (ref 150–400)
RBC: 4.57 MIL/uL (ref 4.22–5.81)
RDW: 12.7 % (ref 11.5–15.5)
WBC Count: 8.5 10*3/uL (ref 4.0–10.5)
nRBC: 0 % (ref 0.0–0.2)

## 2019-01-16 MED ORDER — OMEPRAZOLE 20 MG PO CPDR: 20.0000 mg | DELAYED_RELEASE_CAPSULE | Freq: Every day | ORAL | 3 refills | Status: DC

## 2019-01-16 NOTE — Progress Notes (Signed)
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  PRIOR THERAPY: Status post surgical exploration and the patient was found not to be a good surgical candidate for resection unless he would have radical right pneumonectomy under the care of Dr. Lianne Moris at [facility].  CURRENT THERAPY: Concurrent chemoradiation with weekly carboplatin for AUC of 2 and paclitaxel 45 mg/M2.  First dose December 26, 2018. Status post 3 cycles.   INTERVAL HISTORY: Nathan [name] 58 y.o. male returns clinic for follow-up visit.  Patient is feeling well today without any concerning complaints except for intermittent esophogeal burning after meals and is exacerbated while laying flat. He states this has been occurring since receiving his radiation treatment. In attempt to alleviate his symptoms, he has been taking Tums without relief. He denies any associated weight loss, odynophagia, loss of appetite, or abdominal pain.   He is tolerating his chemotherapy treatment well without any adverse effects.  Denied having any fevers, chills, night sweats, or weight loss.  He denies any shortness of breath, coughing, hemoptysis, or chest pain.  Denies any nausea, vomiting, diarrhea, or constipation.  Denies any headache or visual changes.  Patient is here today for evaluation prior to starting cycle #4 tomorrow.   MEDICAL HISTORY: Past Medical History:  Diagnosis Date  . Arthritis    dengenerative joint surgery-  R hip & L hip  . Family history of adverse reaction to anesthesia    PAternal Margot Chimes- age 54's  "died due to too much anesthesia"  . History of kidney stones    laser surgery    ALLERGIES:  has No Known Allergies.  MEDICATIONS:  Current Outpatient Medications  Medication Sig Dispense Refill  . methocarbamol (ROBAXIN) 500 MG tablet Take 1 tablet (500 mg total) by mouth 4 (four) times daily. Do not drive while taking this medication. 40 tablet 0  . prochlorperazine (COMPAZINE) 10 MG tablet Take 1 tablet (10 mg total) by mouth every 6 (six) hours as needed for nausea or vomiting. 30 tablet 0  . sucralfate (CARAFATE) 1 g tablet Take 1 tablet (1 g total) by mouth 4 (four) times daily -  with meals and at bedtime. Crush and dissolve in 10 cc of water prior to swallowing 120 tablet 0  . aspirin 81 MG chewable tablet Chew 81 mg by mouth daily.    . naproxen (NAPROSYN) 500 MG tablet Take 500 mg by mouth 2 (two) times daily with a meal.    . nicotine (NICODERM CQ - DOSED IN MG/24 HR) 7 mg/24hr patch Place 7 mg onto the skin daily.    . nicotine polacrilex (NICORETTE) 2 MG gum Take 2 mg by mouth as needed for smoking cessation.    Marland Kitchen omeprazole (PRILOSEC) 20 MG capsule Take 1 capsule (20 mg total) by mouth daily. 30 capsule 3  . senna-docusate (SENOKOT-S) 8.6-50 MG tablet Take 2 tablets by mouth at bedtime as needed for mild constipation.    . sildenafil (VIAGRA) 100 MG tablet Take 50 mg by mouth daily as needed for erectile dysfunction.     No current facility-administered medications  for this visit.     SURGICAL HISTORY:  Past Surgical History:  Procedure Laterality Date  . HAND SURGERY Right 2008   thumb with pin  . LITHOTRIPSY    . TOTAL HIP ARTHROPLASTY Right 08/30/2017   Procedure: TOTAL HIP ARTHROPLASTY ANTERIOR APPROACH;  Surgeon: Rod Can, MD;  Location: [facility];  Service: Orthopedics;  Laterality: Right;  . TOTAL HIP ARTHROPLASTY Left 11/29/2017   Procedure: LEFT TOTAL HIP ARTHROPLASTY ANTERIOR APPROACH;  Surgeon: Rod Can, MD;  Location: [facility];  Service:  Orthopedics;  Laterality: Left;  Needs RNFA    REVIEW OF SYSTEMS:   Review of Systems  Constitutional: Negative for appetite change, chills, fatigue, fever and unexpected weight change.  HENT:   Negative for mouth sores, nosebleeds, sore throat and trouble swallowing.   Eyes: Negative for eye problems and icterus.  Respiratory: Negative for cough, hemoptysis, shortness of breath and wheezing.   Cardiovascular: Negative for chest pain and leg swelling.  Gastrointestinal: Negative for abdominal pain, constipation, diarrhea, nausea and vomiting.  Genitourinary: Negative for bladder incontinence, difficulty urinating, dysuria, frequency and hematuria.   Musculoskeletal: Negative for back pain, gait problem, neck pain and neck stiffness.  Skin: Negative for itching and rash.  Neurological: Negative for dizziness, extremity weakness, gait problem, headaches, light-headedness and seizures.  Hematological: Negative for adenopathy. Does not bruise/bleed easily.  Psychiatric/Behavioral: Negative for confusion, depression and sleep disturbance. The patient is not nervous/anxious.     PHYSICAL EXAMINATION:  Blood pressure 126/86, pulse 98, temperature 97.6 F (36.4 C), temperature source Oral, resp. rate 18, height 5\' 11"  (1.803 m), weight 165 lb 6.4 oz (75 kg), SpO2 100 %.  ECOG PERFORMANCE STATUS: 1 - Symptomatic but completely ambulatory  Physical Exam  Constitutional: Oriented to person, place, and time and well-developed, well-nourished, and in no distress. No distress.  HENT:  Head: Normocephalic and atraumatic.  Mouth/Throat: Oropharynx is clear and moist. No oropharyngeal exudate.  Eyes: Conjunctivae are normal. Right eye exhibits no discharge. Left eye exhibits no discharge. No scleral icterus.  Neck: Normal range of motion. Neck supple.  Cardiovascular: Normal rate, regular rhythm, normal heart sounds and intact distal pulses.   Pulmonary/Chest: Effort normal and breath sounds normal.  No respiratory distress. No wheezes. No rales.  Abdominal: Soft. Bowel sounds are normal. Exhibits no distension and no mass. There is no tenderness.  Musculoskeletal: Normal range of motion. Exhibits no edema.  Lymphadenopathy:    No cervical adenopathy.  Neurological: Alert and oriented to person, place, and time. Exhibits normal muscle tone. Gait normal. Coordination normal.  Skin: Skin is warm and dry. No rash noted. Not diaphoretic. No erythema. No pallor.  Psychiatric: Mood, memory and judgment normal.  Vitals reviewed.  LABORATORY DATA: Lab Results  Component Value Date   WBC 8.5 01/16/2019   HGB 14.2 01/16/2019   HCT 42.6 01/16/2019   MCV 93.2 01/16/2019   PLT 174 01/16/2019      Chemistry      Component Value Date/Time   NA 137 01/16/2019 0914   K 4.1 01/16/2019 0914   CL 103 01/16/2019 0914   CO2 26 01/16/2019 0914   BUN 12 01/16/2019 0914   CREATININE 0.81 01/16/2019 0914      Component Value Date/Time   CALCIUM 9.6 01/16/2019 0914   ALKPHOS 91 01/16/2019 0914   AST 19 01/16/2019 0914   ALT 34 01/16/2019 0914   BILITOT 0.7 01/16/2019 0914       RADIOGRAPHIC STUDIES:  No results found.  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  The patient is undergoing concurrent chemoradiation with weekly carboplatin for AUC of 2 and paclitaxel 45 mg/M2 initially for 5 weeks then reevaluation for surgery. If the patient is not a good surgical candidate for resection, he may continue 2 more weeks of concurrent chemoradiation followed by consolidation immunotherapy with Imfinzi. First dose of treatment was on  February 3rd, 2020. He is status post 3 cycles.   The patient was seen with Dr. Julien Nordmann today.  He continutes to tolerate treatment well without any adverse effects.  We recommend for him to proceed with cycle #4 tomorrow as scheduled.   I will arrange for the patient to get a repeat CT scan in about 2.5 weeks after completing his last radiation treatment which is scheduled for 01/27/2019. We will see him back in approximately 3 weeks to discuss the CT scan results and reevaluate the treatment options at that time.   The patient expressed concern for an esophageal burning pain while laying flat and after meals. A prescription for omeprazole was sent to his pharmacy. We discussed the proper use of this medication including but not limited to taking 30-60 minutes before meals, sitting up for approximately 60 minutes after eating, as well as taking the medication on a regular basis.  The patient was advised to call immediately if he has any concerning symptoms in the interval. The patient voices understanding of current disease status and treatment options and is in agreement with the current care plan. All questions were answered. The patient knows to call the clinic with any problems, questions or concerns. We can certainly see the patient much sooner if necessary    Orders Placed This Encounter  Procedures  . CT Chest W Contrast    Standing Status:   Future    Standing Expiration Date:   01/16/2020    Order Specific Question:   ** REASON FOR EXAM (FREE TEXT)    Answer:   Restaging Squamous Cell Lung Cancer    Order Specific Question:   If indicated for the ordered procedure, I authorize the administration of contrast media per Radiology protocol    Answer:   Yes    Order Specific Question:   Preferred imaging location?    Answer:   [facility]    Order Specific Question:   Radiology Contrast Protocol - do NOT remove file path    Answer:   [email or web address]      Albeiro Trompeter L Harlem Bula, PA-C 01/16/19  ADDENDUM: Hematology/Oncology Attending: I had a face-to-face encounter with the patient today.  I recommended his care plan.  [name] with a stage IIIa non-small cell lung cancer, status post 3 cycles.  The patient has been tolerating the treatment well with no concerning adverse effects. I recommended for him to proceed with 2 more weeks of concurrent chemoradiation before repeating CT scan of the chest to evaluate his surgical option by Dr. Lianne Moris at [facility]. I will see the patient back for follow-up visit at that time and if no significant improvement in his disease and the patient is not a candidate  for surgical resection, he will continue with 2 more treatment of the same regimen of concurrent chemoradiation. The patient was advised to call immediately if he has any concerning symptoms in the interval. Disclaimer: This note was dictated with voice recognition software. Similar sounding words can inadvertently be transcribed and may be missed upon review. Eilleen Kempf, MD 01/16/19

## 2019-01-16 NOTE — Telephone Encounter (Signed)
Gave avs and calendar ° °

## 2019-01-17 ENCOUNTER — Other Ambulatory Visit: Payer: No Typology Code available for payment source

## 2019-01-17 ENCOUNTER — Ambulatory Visit
Admission: RE | Admit: 2019-01-17 | Discharge: 2019-01-17 | Disposition: A | Discharge status: Home / Self Care | Payer: No Typology Code available for payment source | Source: Ambulatory Visit | Attending: Radiation Oncology | Admitting: Radiation Oncology

## 2019-01-17 ENCOUNTER — Ambulatory Visit: Payer: No Typology Code available for payment source | Admitting: Internal Medicine

## 2019-01-17 ENCOUNTER — Inpatient Hospital Stay: Payer: No Typology Code available for payment source

## 2019-01-17 ENCOUNTER — Ambulatory Visit: Payer: No Typology Code available for payment source

## 2019-01-17 VITALS — BP 139/82 | HR 79 | Temp 98.6°F | Resp 16

## 2019-01-17 DIAGNOSIS — Z5111 Encounter for antineoplastic chemotherapy: Secondary | ICD-10-CM | POA: Diagnosis not present

## 2019-01-17 DIAGNOSIS — C3491 Malignant neoplasm of unspecified part of right bronchus or lung: Secondary | ICD-10-CM

## 2019-01-17 MED ORDER — DIPHENHYDRAMINE HCL 50 MG/ML IJ SOLN
INTRAMUSCULAR | Status: AC
  Filled 2019-01-17: qty 1

## 2019-01-17 MED ORDER — SODIUM CHLORIDE 0.9 % IV SOLN
20.0000 mg | Freq: Once | INTRAVENOUS | Status: AC
  Administered 2019-01-17: 20 mg via INTRAVENOUS
  Filled 2019-01-17: qty 20

## 2019-01-17 MED ORDER — SODIUM CHLORIDE 0.9 % IV SOLN: 20.0000 mg | Freq: Once | INTRAVENOUS | Status: DC

## 2019-01-17 MED ORDER — SODIUM CHLORIDE 0.9 % IV SOLN
Freq: Once | INTRAVENOUS | Status: AC
  Administered 2019-01-17: 09:00:00 via INTRAVENOUS
  Filled 2019-01-17: qty 250

## 2019-01-17 MED ORDER — DIPHENHYDRAMINE HCL 50 MG/ML IJ SOLN
50.0000 mg | Freq: Once | INTRAMUSCULAR | Status: AC
  Administered 2019-01-17: 50 mg via INTRAVENOUS

## 2019-01-17 MED ORDER — SODIUM CHLORIDE 0.9 % IV SOLN
20.0000 mg | Freq: Once | INTRAVENOUS | Status: AC
  Administered 2019-01-17: 20 mg via INTRAVENOUS
  Filled 2019-01-17: qty 2

## 2019-01-17 MED ORDER — SODIUM CHLORIDE 0.9 % IV SOLN
45.0000 mg/m2 | Freq: Once | INTRAVENOUS | Status: AC
  Administered 2019-01-17: 84 mg via INTRAVENOUS
  Filled 2019-01-17: qty 14

## 2019-01-17 MED ORDER — PALONOSETRON HCL INJECTION 0.25 MG/5ML
0.2500 mg | Freq: Once | INTRAVENOUS | Status: AC
  Administered 2019-01-17: 0.25 mg via INTRAVENOUS

## 2019-01-17 MED ORDER — PALONOSETRON HCL INJECTION 0.25 MG/5ML
INTRAVENOUS | Status: AC
  Filled 2019-01-17: qty 5

## 2019-01-17 MED ORDER — SODIUM CHLORIDE 0.9 % IV SOLN
262.2000 mg | Freq: Once | INTRAVENOUS | Status: AC
  Administered 2019-01-17: 260 mg via INTRAVENOUS
  Filled 2019-01-17: qty 26

## 2019-01-17 NOTE — Patient Instructions (Signed)
   [facility] Discharge Instructions for Patients Receiving Chemotherapy  Today you received the following chemotherapy agents Taxol and Carboplatin   To help prevent nausea and vomiting after your treatment, we encourage you to take your nausea medication as directed.    If you develop nausea and vomiting that is not controlled by your nausea medication, call the clinic.   BELOW ARE SYMPTOMS THAT SHOULD BE REPORTED IMMEDIATELY:  *FEVER GREATER THAN 100.5 F  *CHILLS WITH OR WITHOUT FEVER  NAUSEA AND VOMITING THAT IS NOT CONTROLLED WITH YOUR NAUSEA MEDICATION  *UNUSUAL SHORTNESS OF BREATH  *UNUSUAL BRUISING OR BLEEDING  TENDERNESS IN MOUTH AND THROAT WITH OR WITHOUT PRESENCE OF ULCERS  *URINARY PROBLEMS  *BOWEL PROBLEMS  UNUSUAL RASH Items with * indicate a potential emergency and should be followed up as soon as possible.  Feel free to call the clinic should you have any questions or concerns. The clinic phone number is [phone].  Please show the CHEMO ALERT CARD at check-in to the Emergency Department and triage nurse.   

## 2019-01-18 ENCOUNTER — Telehealth: Payer: Self-pay

## 2019-01-18 ENCOUNTER — Ambulatory Visit
Admission: RE | Admit: 2019-01-18 | Discharge: 2019-01-18 | Disposition: A | Discharge status: Home / Self Care | Payer: No Typology Code available for payment source | Source: Ambulatory Visit | Attending: Radiation Oncology | Admitting: Radiation Oncology

## 2019-01-18 DIAGNOSIS — Z5111 Encounter for antineoplastic chemotherapy: Secondary | ICD-10-CM | POA: Diagnosis not present

## 2019-01-18 NOTE — Telephone Encounter (Signed)
Pt presents today to nursing clinic after radiation treatment. Pt states he had diarrhea last night, two occurrences. Pt reports that diarrhea stopped after eating cheese and ice cream. Pt reports he felt feverish and reports that his temp on the forehead thermometer 98.6. Pt states that he put ice on his head, trying to lower temp and feel "less feverish". Pt denies any vomiting. VS were taken in the clinic and were: 116/74 100% RA 89 HR 18 RR 99.4 oral  Pt reports receiving fourth chemotherapy infusion yesterday. Pt reports his malaise is improving as time goes on.   Pt was offered to be transported to ED, pt declined. Pt was then encouraged to present to urgent care or PCP for any further concerning s/s or worsening of s/s.   Loma Sousa, RN BSN

## 2019-01-19 ENCOUNTER — Ambulatory Visit
Admission: RE | Admit: 2019-01-19 | Discharge: 2019-01-19 | Disposition: A | Discharge status: Home / Self Care | Payer: No Typology Code available for payment source | Source: Ambulatory Visit | Attending: Radiation Oncology | Admitting: Radiation Oncology

## 2019-01-19 DIAGNOSIS — Z5111 Encounter for antineoplastic chemotherapy: Secondary | ICD-10-CM | POA: Diagnosis not present

## 2019-01-20 ENCOUNTER — Ambulatory Visit
Admission: RE | Admit: 2019-01-20 | Discharge: 2019-01-20 | Disposition: A | Discharge status: Home / Self Care | Payer: No Typology Code available for payment source | Source: Ambulatory Visit | Attending: Radiation Oncology | Admitting: Radiation Oncology

## 2019-01-20 DIAGNOSIS — Z5111 Encounter for antineoplastic chemotherapy: Secondary | ICD-10-CM | POA: Diagnosis not present

## 2019-01-23 ENCOUNTER — Telehealth: Payer: Self-pay

## 2019-01-23 ENCOUNTER — Ambulatory Visit
Admission: RE | Admit: 2019-01-23 | Discharge: 2019-01-23 | Disposition: A | Discharge status: Home / Self Care | Payer: No Typology Code available for payment source | Source: Ambulatory Visit | Attending: Radiation Oncology | Admitting: Radiation Oncology

## 2019-01-23 ENCOUNTER — Inpatient Hospital Stay: Payer: No Typology Code available for payment source

## 2019-01-23 ENCOUNTER — Other Ambulatory Visit: Payer: No Typology Code available for payment source

## 2019-01-23 ENCOUNTER — Ambulatory Visit: Payer: No Typology Code available for payment source

## 2019-01-23 ENCOUNTER — Telehealth: Payer: Self-pay | Admitting: Internal Medicine

## 2019-01-23 DIAGNOSIS — C3491 Malignant neoplasm of unspecified part of right bronchus or lung: Secondary | ICD-10-CM | POA: Insufficient documentation

## 2019-01-23 LAB — CBC WITH DIFFERENTIAL (CANCER CENTER ONLY)
Abs Immature Granulocytes: 0.09 10*3/uL — ABNORMAL HIGH (ref 0.00–0.07)
BASOS ABS: 0.1 10*3/uL (ref 0.0–0.1)
Basophils Relative: 1 %
Eosinophils Absolute: 0.1 10*3/uL (ref 0.0–0.5)
Eosinophils Relative: 2 %
HCT: 37.7 % — ABNORMAL LOW (ref 39.0–52.0)
Hemoglobin: 12.5 g/dL — ABNORMAL LOW (ref 13.0–17.0)
Immature Granulocytes: 1 %
Lymphocytes Relative: 18 %
Lymphs Abs: 1.3 10*3/uL (ref 0.7–4.0)
MCH: 31.3 pg (ref 26.0–34.0)
MCHC: 33.2 g/dL (ref 30.0–36.0)
MCV: 94.5 fL (ref 80.0–100.0)
Monocytes Absolute: 0.7 10*3/uL (ref 0.1–1.0)
Monocytes Relative: 9 %
NRBC: 0 % (ref 0.0–0.2)
Neutro Abs: 4.8 10*3/uL (ref 1.7–7.7)
Neutrophils Relative %: 69 %
Platelet Count: 142 10*3/uL — ABNORMAL LOW (ref 150–400)
RBC: 3.99 MIL/uL — ABNORMAL LOW (ref 4.22–5.81)
RDW: 12.7 % (ref 11.5–15.5)
WBC: 7 10*3/uL (ref 4.0–10.5)

## 2019-01-23 LAB — CMP (CANCER CENTER ONLY)
ALK PHOS: 78 U/L (ref 38–126)
ALT: 37 U/L (ref 0–44)
ANION GAP: 7 (ref 5–15)
AST: 21 U/L (ref 15–41)
Albumin: 3.8 g/dL (ref 3.5–5.0)
BUN: 13 mg/dL (ref 6–20)
CO2: 26 mmol/L (ref 22–32)
Calcium: 8.7 mg/dL — ABNORMAL LOW (ref 8.9–10.3)
Chloride: 103 mmol/L (ref 98–111)
Creatinine: 0.95 mg/dL (ref 0.61–1.24)
GFR, Est AFR Am: >60 mL/min (ref >60)
GFR, Estimated: >60 mL/min (ref >60)
Glucose, Bld: 102 mg/dL — ABNORMAL HIGH (ref 70–99)
Potassium: 4.1 mmol/L (ref 3.5–5.1)
SODIUM: 136 mmol/L (ref 135–145)
Total Bilirubin: 0.8 mg/dL (ref 0.3–1.2)
Total Protein: 7 g/dL (ref 6.5–8.1)

## 2019-01-23 NOTE — Telephone Encounter (Signed)
Called Nathan [name] to see if he could come in at 0830 instead of 0730.  I advised him to call back to confirm before I changed his appointment but I did put appointment notes in that I had called him.  He has radiation at 1130 and he would be done with treatment in infusion by that time.  [name]

## 2019-01-23 NOTE — Telephone Encounter (Signed)
Customer walk in to change appt for today. Moved to 3/3. Confirmed with patient

## 2019-01-24 ENCOUNTER — Inpatient Hospital Stay: Payer: No Typology Code available for payment source

## 2019-01-24 ENCOUNTER — Ambulatory Visit
Admission: RE | Admit: 2019-01-24 | Discharge: 2019-01-24 | Disposition: A | Discharge status: Home / Self Care | Payer: No Typology Code available for payment source | Source: Ambulatory Visit | Attending: Radiation Oncology | Admitting: Radiation Oncology

## 2019-01-24 VITALS — BP 110/70 | HR 72 | Temp 98.1°F | Resp 17

## 2019-01-24 DIAGNOSIS — C3491 Malignant neoplasm of unspecified part of right bronchus or lung: Secondary | ICD-10-CM | POA: Diagnosis not present

## 2019-01-24 MED ORDER — DIPHENHYDRAMINE HCL 50 MG/ML IJ SOLN
50.0000 mg | Freq: Once | INTRAMUSCULAR | Status: AC
  Administered 2019-01-24: 50 mg via INTRAVENOUS

## 2019-01-24 MED ORDER — SODIUM CHLORIDE 0.9 % IV SOLN
230.0000 mg | Freq: Once | INTRAVENOUS | Status: AC
  Administered 2019-01-24: 230 mg via INTRAVENOUS
  Filled 2019-01-24: qty 23

## 2019-01-24 MED ORDER — PALONOSETRON HCL INJECTION 0.25 MG/5ML
INTRAVENOUS | Status: AC
  Filled 2019-01-24: qty 5

## 2019-01-24 MED ORDER — SONAFINE EX EMUL
1.0000 "application " | Freq: Two times a day (BID) | CUTANEOUS | Status: DC
  Administered 2019-01-24: 1 via TOPICAL

## 2019-01-24 MED ORDER — SODIUM CHLORIDE 0.9 % IV SOLN: 262.2000 mg | Freq: Once | INTRAVENOUS | Status: DC

## 2019-01-24 MED ORDER — SODIUM CHLORIDE 0.9 % IV SOLN: 20.0000 mg | Freq: Once | INTRAVENOUS | Status: DC

## 2019-01-24 MED ORDER — SODIUM CHLORIDE 0.9 % IV SOLN
45.0000 mg/m2 | Freq: Once | INTRAVENOUS | Status: AC
  Administered 2019-01-24: 84 mg via INTRAVENOUS
  Filled 2019-01-24: qty 14

## 2019-01-24 MED ORDER — SODIUM CHLORIDE 0.9 % IV SOLN
20.0000 mg | Freq: Once | INTRAVENOUS | Status: AC
  Administered 2019-01-24: 20 mg via INTRAVENOUS
  Filled 2019-01-24: qty 20

## 2019-01-24 MED ORDER — SODIUM CHLORIDE 0.9 % IV SOLN
Freq: Once | INTRAVENOUS | Status: AC
  Administered 2019-01-24: 08:00:00 via INTRAVENOUS
  Filled 2019-01-24: qty 250

## 2019-01-24 MED ORDER — PALONOSETRON HCL INJECTION 0.25 MG/5ML
0.2500 mg | Freq: Once | INTRAVENOUS | Status: AC
  Administered 2019-01-24: 0.25 mg via INTRAVENOUS

## 2019-01-24 MED ORDER — SODIUM CHLORIDE 0.9 % IV SOLN
20.0000 mg | Freq: Once | INTRAVENOUS | Status: AC
  Administered 2019-01-24: 20 mg via INTRAVENOUS
  Filled 2019-01-24: qty 2

## 2019-01-24 MED ORDER — DIPHENHYDRAMINE HCL 50 MG/ML IJ SOLN
INTRAMUSCULAR | Status: AC
  Filled 2019-01-24: qty 1

## 2019-01-25 ENCOUNTER — Ambulatory Visit
Admission: RE | Admit: 2019-01-25 | Discharge: 2019-01-25 | Disposition: A | Discharge status: Home / Self Care | Payer: No Typology Code available for payment source | Source: Ambulatory Visit | Attending: Radiation Oncology | Admitting: Radiation Oncology

## 2019-01-25 DIAGNOSIS — C3491 Malignant neoplasm of unspecified part of right bronchus or lung: Secondary | ICD-10-CM | POA: Diagnosis not present

## 2019-01-26 ENCOUNTER — Ambulatory Visit
Admission: RE | Admit: 2019-01-26 | Discharge: 2019-01-26 | Disposition: A | Discharge status: Home / Self Care | Payer: No Typology Code available for payment source | Source: Ambulatory Visit | Attending: Radiation Oncology | Admitting: Radiation Oncology

## 2019-01-26 DIAGNOSIS — C3491 Malignant neoplasm of unspecified part of right bronchus or lung: Secondary | ICD-10-CM | POA: Diagnosis not present

## 2019-01-27 ENCOUNTER — Ambulatory Visit
Admission: RE | Admit: 2019-01-27 | Discharge: 2019-01-27 | Disposition: A | Discharge status: Home / Self Care | Payer: No Typology Code available for payment source | Source: Ambulatory Visit | Attending: Radiation Oncology | Admitting: Radiation Oncology

## 2019-01-27 ENCOUNTER — Inpatient Hospital Stay: Payer: No Typology Code available for payment source

## 2019-01-27 DIAGNOSIS — C3491 Malignant neoplasm of unspecified part of right bronchus or lung: Secondary | ICD-10-CM | POA: Diagnosis not present

## 2019-01-30 ENCOUNTER — Inpatient Hospital Stay: Payer: No Typology Code available for payment source

## 2019-01-30 ENCOUNTER — Inpatient Hospital Stay: Payer: No Typology Code available for payment source | Admitting: Internal Medicine

## 2019-01-30 ENCOUNTER — Telehealth: Payer: Self-pay

## 2019-01-30 ENCOUNTER — Ambulatory Visit
Admission: RE | Admit: 2019-01-30 | Discharge: 2019-01-30 | Disposition: A | Discharge status: Home / Self Care | Payer: No Typology Code available for payment source | Source: Ambulatory Visit | Attending: Radiation Oncology | Admitting: Radiation Oncology

## 2019-01-30 DIAGNOSIS — C3491 Malignant neoplasm of unspecified part of right bronchus or lung: Secondary | ICD-10-CM | POA: Diagnosis not present

## 2019-01-30 NOTE — Telephone Encounter (Addendum)
Per Julien Nordmann he wants to wait for scan results on 3/17 before scheduling any other therapy. Pt notified.

## 2019-01-30 NOTE — Telephone Encounter (Signed)
Called and spoke to Mr. Martinique about his missed appointments for lab, Dr. Julien Nordmann, and Carbo/Taxol.  He had hs last (fifth) radiation treatment today and he was under the impression he only needed 5 rounds of carbo/taxol. He had an appointment scheduled for today and also has one scheduled for 3/16.  Could we clarify this and call him back?  He also stated he has a CT scan coming up to determine if he needs surgery.    I told him we would call him back to clarify his appointments and treatment plan. [name]

## 2019-01-31 ENCOUNTER — Ambulatory Visit
Admission: RE | Admit: 2019-01-31 | Discharge: 2019-01-31 | Disposition: A | Discharge status: Home / Self Care | Payer: No Typology Code available for payment source | Source: Ambulatory Visit | Attending: Radiation Oncology | Admitting: Radiation Oncology

## 2019-01-31 DIAGNOSIS — C3491 Malignant neoplasm of unspecified part of right bronchus or lung: Secondary | ICD-10-CM | POA: Diagnosis not present

## 2019-01-31 MED ORDER — SONAFINE EX EMUL
1.0000 "application " | Freq: Once | CUTANEOUS | Status: AC
  Administered 2019-01-31: 1 via TOPICAL

## 2019-02-01 ENCOUNTER — Ambulatory Visit
Admission: RE | Admit: 2019-02-01 | Discharge: 2019-02-01 | Disposition: A | Discharge status: Home / Self Care | Payer: No Typology Code available for payment source | Source: Ambulatory Visit | Attending: Radiation Oncology | Admitting: Radiation Oncology

## 2019-02-01 DIAGNOSIS — C3491 Malignant neoplasm of unspecified part of right bronchus or lung: Secondary | ICD-10-CM | POA: Diagnosis not present

## 2019-02-02 ENCOUNTER — Ambulatory Visit
Admission: RE | Admit: 2019-02-02 | Discharge: 2019-02-02 | Disposition: A | Discharge status: Home / Self Care | Payer: No Typology Code available for payment source | Source: Ambulatory Visit | Attending: Radiation Oncology | Admitting: Radiation Oncology

## 2019-02-02 DIAGNOSIS — C3491 Malignant neoplasm of unspecified part of right bronchus or lung: Secondary | ICD-10-CM | POA: Diagnosis not present

## 2019-02-03 ENCOUNTER — Ambulatory Visit
Admission: RE | Admit: 2019-02-03 | Discharge: 2019-02-03 | Disposition: A | Discharge status: Home / Self Care | Payer: No Typology Code available for payment source | Source: Ambulatory Visit | Attending: Radiation Oncology | Admitting: Radiation Oncology

## 2019-02-03 ENCOUNTER — Other Ambulatory Visit: Payer: Self-pay

## 2019-02-03 DIAGNOSIS — C3491 Malignant neoplasm of unspecified part of right bronchus or lung: Secondary | ICD-10-CM | POA: Diagnosis not present

## 2019-02-06 ENCOUNTER — Other Ambulatory Visit: Payer: No Typology Code available for payment source

## 2019-02-06 ENCOUNTER — Ambulatory Visit
Admission: RE | Admit: 2019-02-06 | Discharge: 2019-02-06 | Disposition: A | Discharge status: Home / Self Care | Payer: No Typology Code available for payment source | Source: Ambulatory Visit | Attending: Radiation Oncology | Admitting: Radiation Oncology

## 2019-02-06 ENCOUNTER — Ambulatory Visit: Payer: No Typology Code available for payment source

## 2019-02-06 ENCOUNTER — Encounter: Payer: Self-pay | Admitting: Radiation Oncology

## 2019-02-06 ENCOUNTER — Other Ambulatory Visit: Payer: Self-pay

## 2019-02-06 DIAGNOSIS — C3491 Malignant neoplasm of unspecified part of right bronchus or lung: Secondary | ICD-10-CM | POA: Diagnosis not present

## 2019-02-07 ENCOUNTER — Ambulatory Visit (HOSPITAL_COMMUNITY)
Admission: RE | Admit: 2019-02-07 | Discharge: 2019-02-07 | Disposition: A | Discharge status: Home / Self Care | Payer: No Typology Code available for payment source | Source: Ambulatory Visit | Attending: Physician Assistant | Admitting: Physician Assistant

## 2019-02-07 ENCOUNTER — Encounter (HOSPITAL_COMMUNITY): Payer: Self-pay

## 2019-02-07 DIAGNOSIS — C3491 Malignant neoplasm of unspecified part of right bronchus or lung: Secondary | ICD-10-CM | POA: Insufficient documentation

## 2019-02-07 MED ORDER — IOHEXOL 300 MG/ML  SOLN
75.0000 mL | Freq: Once | INTRAMUSCULAR | Status: AC | PRN
  Administered 2019-02-07: 75 mL via INTRAVENOUS

## 2019-02-07 MED ORDER — SODIUM CHLORIDE (PF) 0.9 % IJ SOLN
INTRAMUSCULAR | Status: AC
  Filled 2019-02-07: qty 50

## 2019-02-08 ENCOUNTER — Telehealth: Payer: Self-pay | Admitting: Physician Assistant

## 2019-02-08 NOTE — Telephone Encounter (Signed)
Spoke to the patient. He recently had a CT scan performed. He completed 5 cycles of chemotherapy. Instructed patient to follow up with his cardiothoracic surgeon, Dr. Lianne Moris, for evaluation to determine if he is a surgical candidate. The patient was told to please schedule an appointment with [place] if he is not a surgical candidate for additional treatments. He was given the number to our clinic. He expressed understanding. All questions answered. Copies of the most recent progress note and CT scan were routed to Dr. Lianne Moris.

## 2019-02-09 NOTE — Progress Notes (Signed)
  Radiation Oncology         [phone] ________________________________  Name: Nathan [name] MRN: [id number]  Date: 02/06/2019  DOB: [birth year]  End of Treatment Note  Diagnosis:   58 y.o. male with stage IIIA(T2a, N2, M0) non-small cell lung cancer    Indication for treatment:  Curative       Radiation treatment dates:   01/03/2019 - 02/06/2019  Site/dose:   Right Lung (right perihilar mass and mediastinal area) / 45 Gy in 25 fractions  Beams/energy:   3D / 6X, 10X Photon  Narrative: The patient tolerated radiation treatment very well with concurrent radiosensitizing chemotherapy.  He is using Carafate to help with swallowing and continues to eat and drink well. He denies having cough, hemoptysis, or shortness of breath. He denies any skin issues or fatigue.  Plan: The patient has completed radiation treatment. Chest CT scan is scheduled in the next several days to determine if he is a surgical candidate. If he is felt to be a poor surgical candidate, he will return to radiation oncology for additional radiotherapy. Otherwise, the patient will return to radiation oncology clinic for routine followup in one month. I advised them to call or return sooner if they have any questions or concerns related to their recovery or treatment.  -----------------------------------  Blair Promise, PhD, MD  This document serves as a record of services personally performed by Gery Pray, MD. It was created on his behalf by Rae Lips, a trained medical scribe. The creation of this record is based on the scribe's personal observations and the provider's statements to them. This document has been checked and approved by the attending provider.

## 2019-02-22 ENCOUNTER — Encounter: Payer: Self-pay | Admitting: General Practice

## 2019-02-22 NOTE — Progress Notes (Signed)
Nathan [name] Team contacted Nathan [name] to assess for food insecurity and other psychosocial needs during current COVID19 pandemic.  "Major concern is I cannot get my surgery scheduled, cannot reach the surgeon at [facility]."  Has finished chemo and [facility] - next step is surgery.  [facility] and will ask them to either reach surgeon/schedule or refer to different provider.  Undersigned will also staff message nurse navigator w Nathan [name] concern.    Nathan [name]/family expressed no needs at this time.  Support Team member encouraged Nathan [name] to call if changes occur or they have any other questions/concerns.   Nathan [name], [name]

## 2019-02-28 ENCOUNTER — Encounter: Payer: Self-pay | Admitting: *Deleted

## 2019-02-28 NOTE — Progress Notes (Signed)
Oncology Nurse Navigator Documentation  Oncology Nurse Navigator Flowsheets 02/28/2019  Navigator Location [facility]  Referral date to RadOnc/MedOnc -  Navigator Encounter Type Other/I called Dr. Lianne Moris office to see when Nathan [name] was going to be seen.  His appt with Dr. Lianne Moris is on 03/16/2019 at 11:00 am and will be a phone visit.  His office needs patient's recent scan push to PACS.  I will work on that today.  Telephone -  Patient Visit Type -  Treatment Phase -  Barriers/Navigation Needs Coordination of Care  Education -  Interventions Coordination of Care  Coordination of Care Radiology;Other  Education Method -  Acuity Level 2  Time Spent with Patient 30

## 2019-02-28 NOTE — Progress Notes (Signed)
CT Chest 02/07/2019 pushed to [employer] PACS requested

## 2019-03-07 ENCOUNTER — Telehealth: Payer: Self-pay

## 2019-03-07 NOTE — Telephone Encounter (Signed)
Contacted pt to check on any radiation related side effects. Pt states he is doing well. Pt most interested in when his appt with Dr. Lianne Moris at [facility] is. Conveyed to him that per navigator note, appt is a phone visit at 03/16/19 1100. Conveyed to pt that f/u appt with Dr. Sondra Come would be rescheduled for June/July 2020. Pt verbalized understanding and agreement. Loma Sousa, RN BSN

## 2019-03-08 ENCOUNTER — Telehealth: Payer: Self-pay | Admitting: *Deleted

## 2019-03-08 NOTE — Telephone Encounter (Signed)
Called patient to alter fu appt. For 03-09-19 due to COVID 19, rescheduled for 06-08-19 @ 8 am, lvm for a return call

## 2019-03-09 ENCOUNTER — Ambulatory Visit: Payer: No Typology Code available for payment source | Admitting: Radiation Oncology

## 2019-04-25 ENCOUNTER — Telehealth: Payer: Self-pay | Admitting: Medical Oncology

## 2019-04-25 NOTE — Telephone Encounter (Signed)
Received a fax from on call service. Pt called and requested a need to get his dressing changed. He had surgery last week . He requested that  someone come out and see him. I returned call and LVM to call me back.

## 2019-06-08 ENCOUNTER — Ambulatory Visit
Admission: RE | Admit: 2019-06-08 | Discharge: 2019-06-08 | Disposition: A | Discharge status: Home / Self Care | Payer: No Typology Code available for payment source | Source: Ambulatory Visit | Attending: Radiation Oncology | Admitting: Radiation Oncology

## 2019-06-08 ENCOUNTER — Encounter: Payer: Self-pay | Admitting: Radiation Oncology

## 2019-06-08 ENCOUNTER — Other Ambulatory Visit: Payer: Self-pay

## 2019-06-08 VITALS — BP 127/85 | HR 75 | Temp 96.9°F | Resp 16 | Ht 73.0 in | Wt 161.5 lb

## 2019-06-08 DIAGNOSIS — Z7982 Long term (current) use of aspirin: Secondary | ICD-10-CM | POA: Diagnosis not present

## 2019-06-08 DIAGNOSIS — C3431 Malignant neoplasm of lower lobe, right bronchus or lung: Secondary | ICD-10-CM | POA: Insufficient documentation

## 2019-06-08 DIAGNOSIS — Z79899 Other long term (current) drug therapy: Secondary | ICD-10-CM | POA: Insufficient documentation

## 2019-06-08 DIAGNOSIS — Z9221 Personal history of antineoplastic chemotherapy: Secondary | ICD-10-CM | POA: Insufficient documentation

## 2019-06-08 DIAGNOSIS — Z923 Personal history of irradiation: Secondary | ICD-10-CM | POA: Diagnosis not present

## 2019-06-08 DIAGNOSIS — C3491 Malignant neoplasm of unspecified part of right bronchus or lung: Secondary | ICD-10-CM

## 2019-06-08 NOTE — Progress Notes (Signed)
Pt presents today for f/u with Dr. Sondra [name]. Pt is post-surgical by Dr. 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 Pt reports breathing is "good even with the mask on". Pt denies cough, hemoptysis. Pt denies difficulty swallowing.   BP 127/85 (BP Location: Right Arm, Patient Position: Sitting)   Pulse 75   Temp (!) 96.9 F (36.1 C) (Temporal)   Resp 16   Ht 6\' 1"  (1.854 m)   Wt 161 lb 8 oz (73.3 kg)   SpO2 98%   BMI 21.31 kg/m   Wt Readings from Last 3 Encounters:  06/08/19 161 lb 8 oz (73.3 kg)  01/16/19 165 lb 6.4 oz (75 kg)  01/02/19 167 lb 12.8 oz (76.1 kg)   Loma Sousa, RN BSN

## 2019-06-08 NOTE — Progress Notes (Signed)
Radiation Oncology         [phone] ________________________________  Name: Nathan [name] MRN: [id number]  Date: 06/08/2019  DOB: October 28, 1961  Follow-Up Visit Note  CC: System, Pcp Not In  [name], MD    ICD-10-CM   1. Non-small cell carcinoma of right lung, stage 3 ([place])  C34.91     Diagnosis:   58 y.o. male with stage IIIA(T2a, N2, M0) non-small cell lung cancer  Interval Since Last Radiation:  4 months   Radiation treatment dates:   01/03/2019 - 02/06/2019  Site/dose:   Right Lung (right perihilar mass and mediastinal area) / 45 Gy in 25 fractions (pre-op)  Narrative:  The patient returns today for routine follow-up.  Upon completion of neoadjuvant chemoradiation, the patient underwent a chest CT scan, dated 02/07/2019, which showed: "Central right lower lobe pulmonary lesion seen on the previous CT is become incorporated into amorphous soft tissue involving the inferior right hilum. The soft tissue narrow some of the airways to the right  lower lobe. Features may be related to post radiation change, but disease progression can not be excluded and close follow-up recommended. PET-CT may prove helpful to further evaluate."      He then underwent right robotic assisted bilobectomy (middle and lower lobes) and mediastinal lymph node dissection with Dr. Lianne Moris at [facility] on 04/12/2019. Final pathology revealed: Invasive poorly differentiated adenocarcinoma, solid predominant, measuring 2.5 cm in greatest dimension. Margins were negative for malignancy. 0/10 lymph nodes positive for malignancy. Pathologic Stage: ypT1c, ypN0.  On review of systems, the patient reports that he is doing well overall. He reports walking 3-4 miles per day. He states that his breathing is "good even with the mask on". He denies cough or hemoptysis. He denies difficulty swallowing. Denies any chest wall pain.          ALLERGIES:  has No Known Allergies.  Meds: Current Outpatient Medications   Medication Sig Dispense Refill  . senna-docusate (SENOKOT-S) 8.6-50 MG tablet Take 2 tablets by mouth at bedtime as needed for mild constipation.    . sildenafil (VIAGRA) 100 MG tablet Take 50 mg by mouth daily as needed for erectile dysfunction.    Marland Kitchen aspirin 81 MG chewable tablet Chew 81 mg by mouth daily.    . methocarbamol (ROBAXIN) 500 MG tablet Take 1 tablet (500 mg total) by mouth 4 (four) times daily. Do not drive while taking this medication. (Patient not taking: Reported on 06/08/2019) 40 tablet 0  . naproxen (NAPROSYN) 500 MG tablet Take 500 mg by mouth 2 (two) times daily with a meal.    . nicotine (NICODERM CQ - DOSED IN MG/24 HR) 7 mg/24hr patch Place 7 mg onto the skin daily.    . nicotine polacrilex (NICORETTE) 2 MG gum Take 2 mg by mouth as needed for smoking cessation.    Marland Kitchen omeprazole (PRILOSEC) 20 MG capsule Take 1 capsule (20 mg total) by mouth daily. (Patient not taking: Reported on 06/08/2019) 30 capsule 3  . prochlorperazine (COMPAZINE) 10 MG tablet Take 1 tablet (10 mg total) by mouth every 6 (six) hours as needed for nausea or vomiting. (Patient not taking: Reported on 06/08/2019) 30 tablet 0  . sucralfate (CARAFATE) 1 g tablet Take 1 tablet (1 g total) by mouth 4 (four) times daily -  with meals and at bedtime. Crush and dissolve in 10 cc of water prior to swallowing (Patient not taking: Reported on 06/08/2019) 120 tablet 0   No current facility-administered medications  for this encounter.     Physical Findings: The patient is in no acute distress. Patient is alert and oriented.  height is 6\' 1"  (1.854 m) and weight is 161 lb 8 oz (73.3 kg). His temporal temperature is 96.9 F (36.1 C) (abnormal). His blood pressure is 127/85 and his pulse is 75. His respiration is 16 and oxygen saturation is 98%.   Lungs are clear to auscultation bilaterally. Heart has regular rate and rhythm. No palpable cervical, supraclavicular, or axillary adenopathy. Abdomen soft, non-tender, normal  bowel sounds.  He has some small scars along the right lower chest from previous surgery. Healed well. No signs of drainage or infection.  Lab Findings: Lab Results  Component Value Date   WBC 7.0 01/23/2019   HGB 12.5 (L) 01/23/2019   HCT 37.7 (L) 01/23/2019   MCV 94.5 01/23/2019   PLT 142 (L) 01/23/2019    Radiographic Findings: No results found.  Impression:  Stage IIIA(T2a, N2, M0) non-small cell lung cancer. Since the patient's pre-op radiation therapy and chemotherapy, he underwent successful right middle and right lower lobectomy at [facility]. Patient did extremely well after his surgery and reports no breathing issues. Patient was offered adjuvant chemotherapy which he initially refused, but after reconsidering, he wishes to proceed with adjuvant chemotherapy which will take place in the near future at [facility]. Patient is very appreciative of the care he received here and thankful that he was eventually able to undergo surgery.   Plan:  PRN follow-up in radiation oncology. The patient will continue close follow-up with thoracic surgery and medical oncology at [facility].   ____________________________________  Blair Promise, PhD, MD  This document serves as a record of services personally performed by Gery Pray, MD. It was created on his behalf by Rae Lips, a trained medical scribe. The creation of this record is based on the scribe's personal observations and the provider's statements to them. This document has been checked and approved by the attending provider.

## 2019-06-08 NOTE — Patient Instructions (Signed)
Coronavirus (COVID-19) Are you at risk?  Are you at risk for the Coronavirus (COVID-19)?  To be considered HIGH RISK for Coronavirus (COVID-19), you have to meet the following criteria:  . Traveled to [place], [place], [place], [place] or [place]; or in the [place] to [place], [place], [place], or [place]; and have fever, cough, and shortness of breath within the last 2 weeks of travel OR . Been in close contact with a person diagnosed with COVID-19 within the last 2 weeks and have fever, cough, and shortness of breath . IF YOU DO NOT MEET THESE CRITERIA, YOU ARE CONSIDERED LOW RISK FOR COVID-19.  What to do if you are HIGH RISK for COVID-19?  . If you are having a medical emergency, call 911. . Seek medical care right away. Before you go to a doctor's office, urgent care or emergency department, call ahead and tell them about your recent travel, contact with someone diagnosed with COVID-19, and your symptoms. You should receive instructions from your physician's office regarding next steps of care.  . When you arrive at healthcare provider, tell the healthcare staff immediately you have returned from visiting [place], [place], [place], [place] or [place]; or traveled in the [place] to [place], [place], [place], or [place]; in the last two weeks or you have been in close contact with a person diagnosed with COVID-19 in the last 2 weeks.   . Tell the health care staff about your symptoms: fever, cough and shortness of breath. . After you have been seen by a medical provider, you will be either: o Tested for (COVID-19) and discharged home on quarantine except to seek medical care if symptoms worsen, and asked to  - Stay home and avoid contact with others until you get your results (4-5 days)  - Avoid travel on public transportation if possible (such as bus, train, or airplane) or o Sent to the Emergency Department by EMS for evaluation, COVID-19 testing, and possible  admission depending on your condition and test results.  What to do if you are LOW RISK for COVID-19?  Reduce your risk of any infection by using the same precautions used for avoiding the common cold or flu:  . Wash your hands often with soap and warm water for at least 20 seconds.  If soap and water are not readily available, use an alcohol-based hand sanitizer with at least 60% alcohol.  . If coughing or sneezing, cover your mouth and nose by coughing or sneezing into the elbow areas of your shirt or coat, into a tissue or into your sleeve (not your hands). . Avoid shaking hands with others and consider head nods or verbal greetings only. . Avoid touching your eyes, nose, or mouth with unwashed hands.  . Avoid close contact with people who are sick. . Avoid places or events with large numbers of people in one location, like concerts or sporting events. . Carefully consider travel plans you have or are making. . If you are planning any travel outside or inside the [place], visit the CDC's Travelers' Health webpage for the latest health notices. . If you have some symptoms but not all symptoms, continue to monitor at home and seek medical attention if your symptoms worsen. . If you are having a medical emergency, call 911.   ADDITIONAL HEALTHCARE OPTIONS FOR PATIENTS  [facility] / e-Visit: [email or web address]         [facility]: [phone]  [place]   [facility]: [phone]                   [facility]: [phone]   

## 2021-03-16 IMAGING — CT CT CHEST WITH CONTRAST
2 of 4 series · 15 of 36 positions shown, 18 images · IV contrast (omnipaque)
Comparison: 03/24/2018

CLINICAL DATA: Lung cancer.  Chemo and XRT complete.

EXAM:
CT CHEST WITH CONTRAST
TECHNIQUE: Multidetector CT imaging of the chest was performed during
intravenous contrast administration.
CONTRAST:  75mL OMNIPAQUE IOHEXOL 300 MG/ML  SOLN

[Series 2: axial st · axial · 0.66mm/px · z∈[-314,-30]mm · 12 of 168 slices shown, 15 images]
[im 13/168  mediastinal]
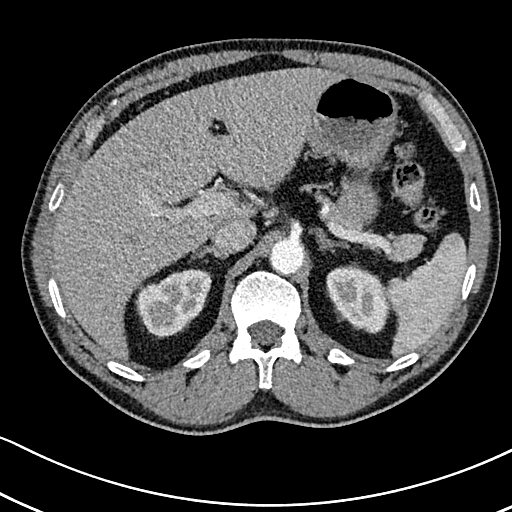
[im 13/168  lung]
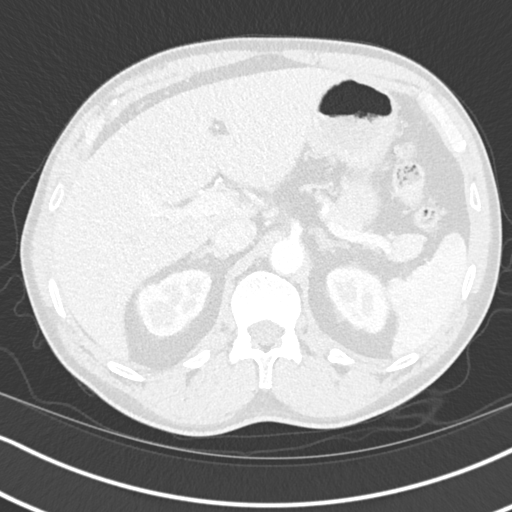
[im 26/168  lung]
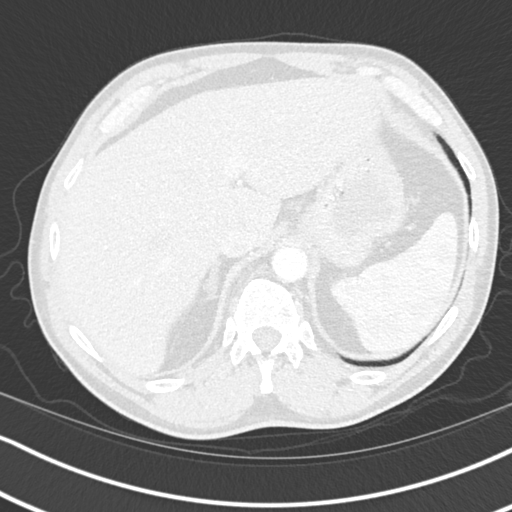
[im 39/168  lung]
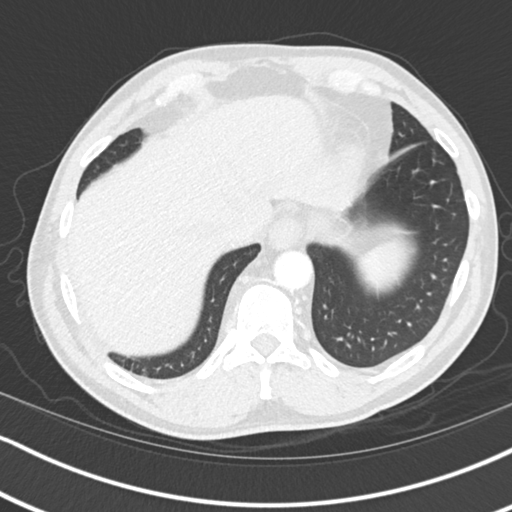
[im 52/168  lung]
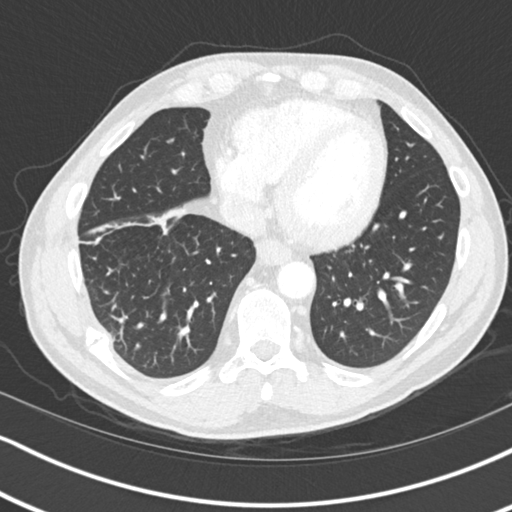
[im 65/168  mediastinal]
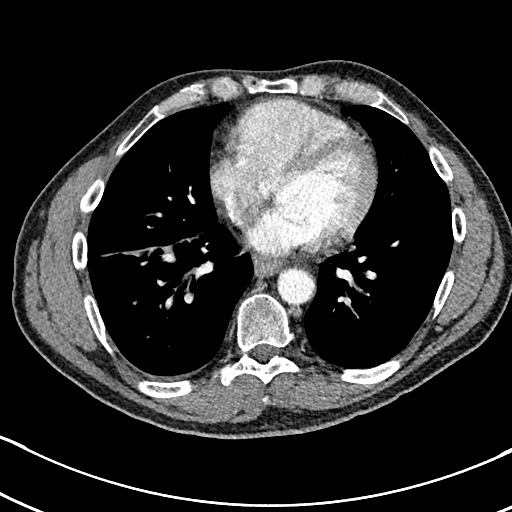
[im 65/168  lung]
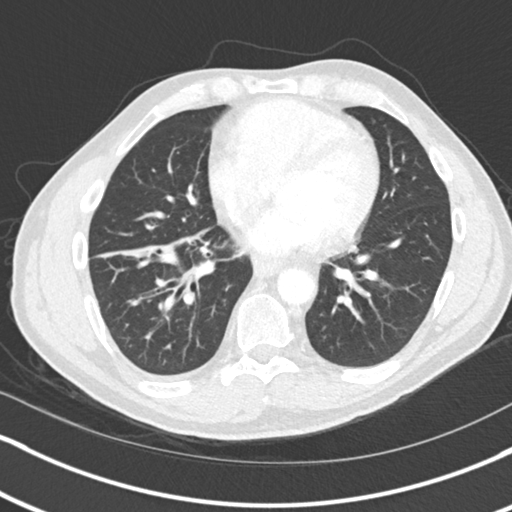
[im 78/168  lung]
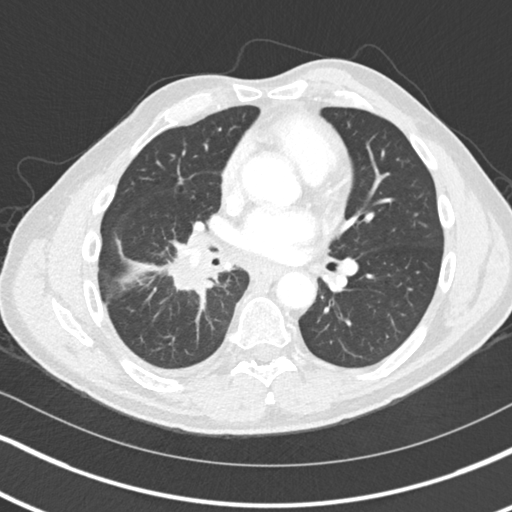
[im 90/168  lung]
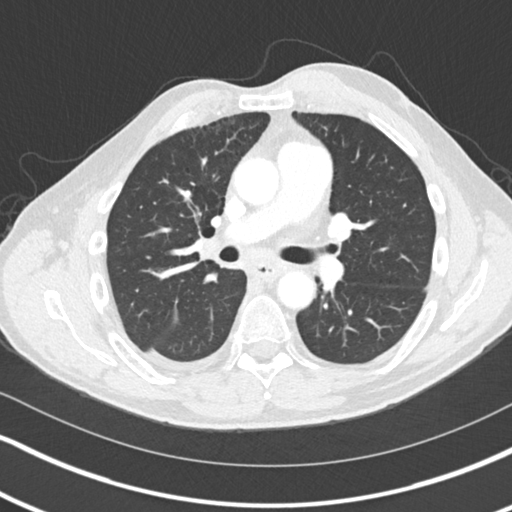
[im 103/168  lung]
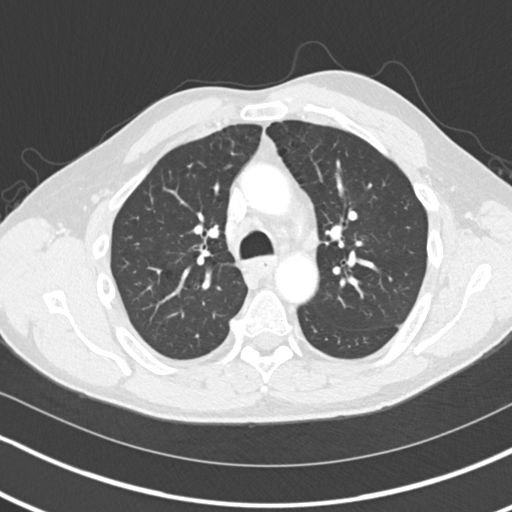
[im 116/168  mediastinal]
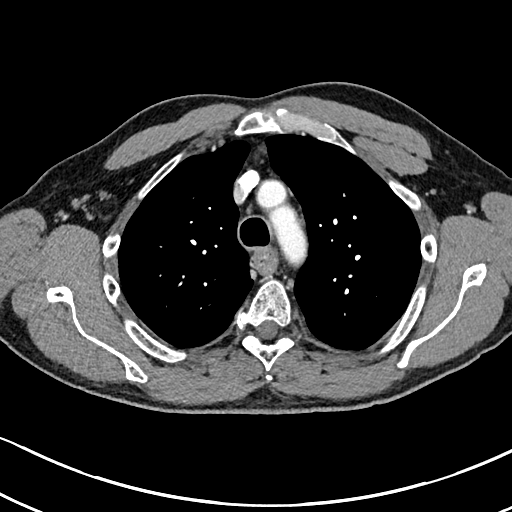
[im 116/168  lung]
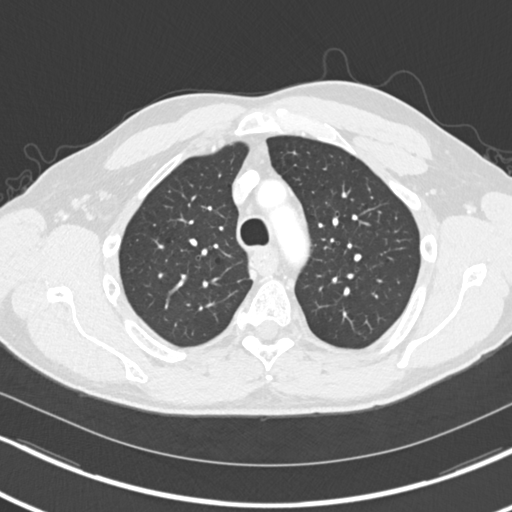
[im 129/168  lung]
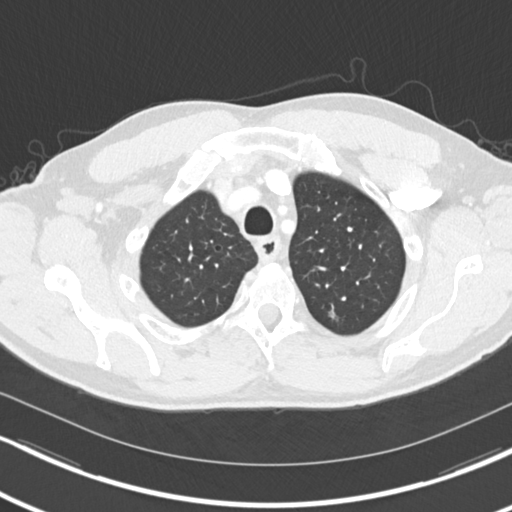
[im 142/168  lung]
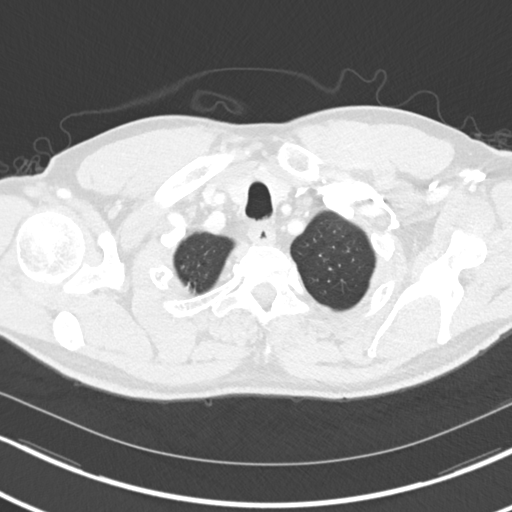
[im 155/168  lung]
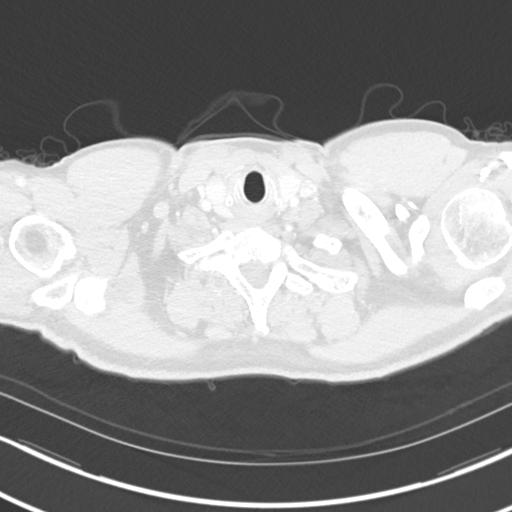

[Series 5: coronal · coronal · 0.67mm/px · 3 of 132 slices shown]
[im 27/132  lung]
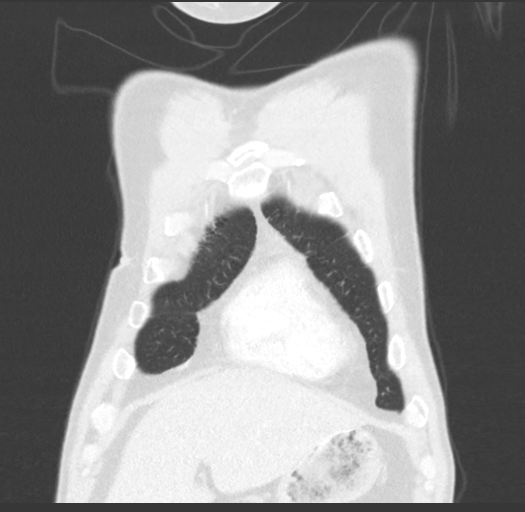
[im 53/132  lung]
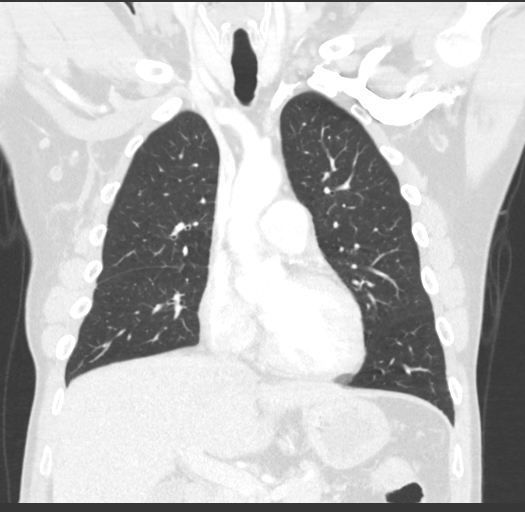
[im 79/132  lung]
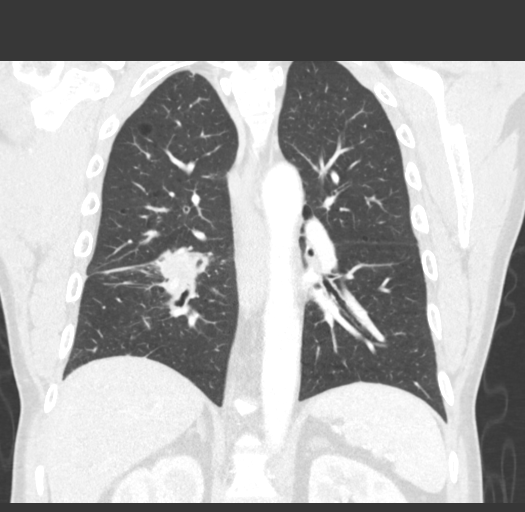

[15 of 36 positions shown; findings below may reference images not displayed]

FINDINGS: Cardiovascular: The heart size is normal. No substantial pericardial
effusion. Atherosclerotic calcification is noted in the wall of the
thoracic aorta. Coronary artery calcification is evident.

Mediastinum/Nodes: 9 mm short axis subcarinal lymph node is similar
to prior. Small paratracheal and AP window lymph nodes are
unchanged. Small left supraclavicular lymph nodes are also similar
to prior. No left hilar lymphadenopathy. Interval progression of
abnormal amorphous soft tissue in the right hilum, potentially
treatment related.

Lungs/Pleura: The central tracheobronchial airways are patent.
Centrilobular emphsyema noted. Parahilar right lower lobe lesion
seen previously has become incorporated into the abnormal soft
tissue involving the central right lower lobe and inferior right
hilum. This abnormal soft tissue narrows segmental right lower lobe
airways (90/7 and 97/7). Anterior right lower lobe atelectasis is
associated.

3 mm nodule posterior left upper lobe likely present on prior study.
No suspicious nodule or mass in the left lung. No evidence for
pleural effusion.

Upper Abdomen: Unremarkable.

Musculoskeletal: No worrisome lytic or sclerotic osseous
abnormality. Nonacute but un healed posterior right ninth rib
fracture evident.
IMPRESSION: 1. Central right lower lobe pulmonary lesion seen on the previous CT
is become incorporated into amorphous soft tissue involving the
inferior right hilum. The soft tissue narrow some of the airways to
the right lower lobe. Features may be related to post radiation
change, but disease progression can not be excluded and close
follow-up recommended. PET-CT may prove helpful to further evaluate.
2.  Emphysema. ([id number]-6CU.X)
3.  Aortic Atherosclerois (F0P6S-170.0)

## 2022-04-16 ENCOUNTER — Encounter: Payer: Self-pay | Admitting: Internal Medicine

## 2022-04-16 ENCOUNTER — Emergency Department (HOSPITAL_COMMUNITY): Payer: Medicare (Managed Care)

## 2022-04-16 ENCOUNTER — Emergency Department (HOSPITAL_COMMUNITY)
Admission: EM | Admit: 2022-04-16 | Discharge: 2022-04-16 | Disposition: A | Discharge status: Home / Self Care | Payer: Medicare (Managed Care) | Attending: Emergency Medicine | Admitting: Emergency Medicine

## 2022-04-16 ENCOUNTER — Other Ambulatory Visit: Payer: Self-pay

## 2022-04-16 DIAGNOSIS — W1839XA Other fall on same level, initial encounter: Secondary | ICD-10-CM | POA: Diagnosis not present

## 2022-04-16 DIAGNOSIS — Z87442 Personal history of urinary calculi: Secondary | ICD-10-CM | POA: Insufficient documentation

## 2022-04-16 DIAGNOSIS — Z85841 Personal history of malignant neoplasm of brain: Secondary | ICD-10-CM | POA: Insufficient documentation

## 2022-04-16 DIAGNOSIS — Z7982 Long term (current) use of aspirin: Secondary | ICD-10-CM | POA: Diagnosis not present

## 2022-04-16 DIAGNOSIS — Z85118 Personal history of other malignant neoplasm of bronchus and lung: Secondary | ICD-10-CM | POA: Diagnosis not present

## 2022-04-16 DIAGNOSIS — M25552 Pain in left hip: Secondary | ICD-10-CM | POA: Insufficient documentation

## 2022-04-16 DIAGNOSIS — G8194 Hemiplegia, unspecified affecting left nondominant side: Secondary | ICD-10-CM | POA: Insufficient documentation

## 2022-04-16 DIAGNOSIS — W19XXXA Unspecified fall, initial encounter: Secondary | ICD-10-CM

## 2022-04-16 DIAGNOSIS — R29810 Facial weakness: Secondary | ICD-10-CM | POA: Diagnosis present

## 2022-04-16 LAB — BASIC METABOLIC PANEL
Anion gap: 9 (ref 5–15)
BUN: 11 mg/dL (ref 8–23)
CO2: 30 mmol/L (ref 22–32)
Calcium: 11.1 mg/dL — ABNORMAL HIGH (ref 8.9–10.3)
Chloride: 100 mmol/L (ref 98–111)
Creatinine, Ser: 0.8 mg/dL (ref 0.61–1.24)
GFR, Estimated: >60 mL/min (ref >60)
Glucose, Bld: 118 mg/dL — ABNORMAL HIGH (ref 70–99)
Potassium: 3.8 mmol/L (ref 3.5–5.1)
Sodium: 139 mmol/L (ref 135–145)

## 2022-04-16 LAB — CBC WITH DIFFERENTIAL/PLATELET
Abs Immature Granulocytes: 0.19 10*3/uL — ABNORMAL HIGH (ref 0.00–0.07)
Basophils Absolute: 0.1 10*3/uL (ref 0.0–0.1)
Basophils Relative: 1 %
Eosinophils Absolute: 0.1 10*3/uL (ref 0.0–0.5)
Eosinophils Relative: 1 %
HCT: 41 % (ref 39.0–52.0)
Hemoglobin: 13.6 g/dL (ref 13.0–17.0)
Immature Granulocytes: 2 %
Lymphocytes Relative: 8 %
Lymphs Abs: 0.8 10*3/uL (ref 0.7–4.0)
MCH: 31.8 pg (ref 26.0–34.0)
MCHC: 33.2 g/dL (ref 30.0–36.0)
MCV: 95.8 fL (ref 80.0–100.0)
Monocytes Absolute: 0.9 10*3/uL (ref 0.1–1.0)
Monocytes Relative: 8 %
Neutro Abs: 8.8 10*3/uL — ABNORMAL HIGH (ref 1.7–7.7)
Neutrophils Relative %: 80 %
Platelets: 187 10*3/uL (ref 150–400)
RBC: 4.28 MIL/uL (ref 4.22–5.81)
RDW: 12.9 % (ref 11.5–15.5)
WBC: 10.9 10*3/uL — ABNORMAL HIGH (ref 4.0–10.5)
nRBC: 0 % (ref 0.0–0.2)

## 2022-04-16 LAB — PROTIME-INR
INR: 0.9 (ref 0.8–1.2)
Prothrombin Time: 12.5 seconds (ref 11.4–15.2)

## 2022-04-16 NOTE — Discharge Instructions (Signed)
You were seen today after a fall.  Your work-up is reassuring.  Nothing appears broken.  Follow-up with your hematologist and oncologist at [facility]

## 2022-04-16 NOTE — ED Provider Notes (Signed)
[facility] EMERGENCY DEPARTMENT Provider Note   CSN: [id number] Arrival date & time: 04/16/22  0304     History  Chief Complaint  Patient presents with   Fall   Facial Droop    Nathan [name] is a 61 y.o. male.  HPI     This is a 61 year old male who presents with left facial droop.  Per the patient, he was sleeping in his chair.  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  He states that he gets around in a wheelchair but is able to transfer on his own at home.  He lives alone.  He woke up and needed to go to the bathroom.  He states "I guess I forgot that my left leg does not work."  He fell onto his left side.  He called EMS to help him back up.  Upon EMS arrival, he was noted to have a left facial droop.  Patient states that he does not believe he has a left facial droop at baseline.  He is unclear when this started.  He is not having any headache.  He currently does not have home health but did have a friend at his house earlier today.  He states he is unsure whether that friend noted a droop or not.  He is complaining of left hip pain.  Outside records reviewed.  Patient primarily receiving his oncology and neurosurgery care at [facility].  He had a craniotomy and tumor resection on 02/20/2022.  He subsequently has had gamma knife therapy. Home Medications Prior to Admission medications   Medication Sig Start Date End Date Taking? Authorizing Provider  aspirin 81 MG chewable tablet Chew 81 mg by mouth daily.   Yes [provider]  ibuprofen (ADVIL) 800 MG tablet Take 800 mg by mouth every 8 (eight) hours as needed for headache.   Yes [provider]  methocarbamol (ROBAXIN) 500 MG tablet Take 1 tablet (500 mg total) by mouth 4 (four) times daily. Do not drive while taking this medication. Patient not taking: Reported on 06/08/2019 01/09/19   Harle Stanford., PA-C   omeprazole (PRILOSEC) 20 MG capsule Take 1 capsule (20 mg total) by mouth daily. Patient not taking: Reported on 06/08/2019 01/16/19   Heilingoetter, Cassandra L, PA-C  prochlorperazine (COMPAZINE) 10 MG tablet Take 1 tablet (10 mg total) by mouth every 6 (six) hours as needed for nausea or vomiting. Patient not taking: Reported on 06/08/2019 12/27/18   [name], MD  sucralfate (CARAFATE) 1 g tablet Take 1 tablet (1 g total) by mouth 4 (four) times daily -  with meals and at bedtime. Crush and dissolve in 10 cc of water prior to swallowing Patient not taking: Reported on 06/08/2019 01/11/19   Gery Pray, MD      Allergies    Patient has no known allergies.    Review of Systems   Review of Systems  Neurological:  Positive for facial asymmetry and weakness. Negative for headaches.  All other systems reviewed and are negative.  Physical Exam Updated Vital Signs BP 114/67   Pulse 80   Temp 98.9 F (37.2 C) (Oral)   Resp 18   Ht 1.765 m (5' 9.5")   Wt 70.3 kg   SpO2 98%   BMI 22.56 kg/m  Physical Exam Vitals and nursing note reviewed.  Constitutional:      Appearance: He is well-developed.  Comments: Chronically ill-appearing  HENT:     Head: Normocephalic and atraumatic.     Mouth/Throat:     Mouth: Mucous membranes are moist.  Eyes:     Pupils: Pupils are equal, round, and reactive to light.  Cardiovascular:     Rate and Rhythm: Normal rate and regular rhythm.  Pulmonary:     Effort: Pulmonary effort is normal. No respiratory distress.  Abdominal:     Palpations: Abdomen is soft.     Tenderness: There is no abdominal tenderness.  Musculoskeletal:     Cervical back: Neck supple.     Comments: Contractured left upper extremity  Lymphadenopathy:     Cervical: No cervical adenopathy.  Skin:    General: Skin is warm and dry.  Neurological:     Mental Status: He is alert and oriented to person, place, and time.     Comments: Left facial droop noted, 0-1 out of 5  strength left upper and lower extremity  Psychiatric:        Mood and Affect: Mood normal.    ED Results / Procedures / Treatments   Labs (all labs ordered are listed, but only abnormal results are displayed) Labs Reviewed  CBC WITH DIFFERENTIAL/PLATELET - Abnormal; Notable for the following components:      Result Value   WBC 10.9 (*)    Neutro Abs 8.8 (*)    Abs Immature Granulocytes 0.19 (*)    All other components within normal limits  BASIC METABOLIC PANEL - Abnormal; Notable for the following components:   Glucose, Bld 118 (*)    Calcium 11.1 (*)    All other components within normal limits  PROTIME-INR    EKG None  Radiology CT Head Wo Contrast  Result Date: 04/16/2022 CLINICAL DATA:  Facial droop.  Brain metastases suspected. EXAM: CT HEAD WITHOUT CONTRAST TECHNIQUE: Contiguous axial images were obtained from the base of the skull through the vertex without intravenous contrast. RADIATION DOSE REDUCTION: This exam was performed according to the departmental dose-optimization program which includes automated exposure control, adjustment of the mA and/or kV according to patient size and/or use of iterative reconstruction technique. COMPARISON:  None FINDINGS: Brain: Two rounded areas in the right temporal and frontal lobe measuring 17 and 15 mm in diameter on axial images. More extensive regional low-density appearance also involving cortex in the right frontal and superficial temporal lobes which is deep to a craniotomy flap, superimposed chronic infarct appearance. No evidence of acute infarct. No acute hemorrhage, hydrocephalus, or collection. In the postoperative region there is volume loss rather than gain. Vascular: No hyperdense vessel or unexpected calcification. Skull: Unremarkable right craniotomy. Sinuses/Orbits: Negative IMPRESSION: 1. History of brain tumor with prior right-sided surgery and 2 underlying masslike areas in the right cerebral hemisphere. Larger area of  superimposed low-density with volume loss suggesting nonacute infarction. 2. No acute finding.  No available comparison. Electronically Signed   By: Jorje Guild M.D.   On: 04/16/2022 05:03   DG Hip Unilat W or Wo Pelvis 2-3 Views Left  Result Date: 04/16/2022 CLINICAL DATA:  Fall. EXAM: DG HIP (WITH OR WITHOUT PELVIS) 2-3V LEFT COMPARISON:  None. FINDINGS: There is no evidence of hip fracture or dislocation. 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 There is no evidence of arthropathy or other focal bone abnormality. IMPRESSION: Negative. Electronically Signed   By: Brett Fairy M.D.   On: 04/16/2022 03:45    Procedures Procedures  Medications Ordered in ED Medications - No data to display  ED Course/ Medical Decision Making/ A&P Clinical Course as of 04/16/22 [id number]  Thu Apr 16, 2022  0548 Spoke with Dr. Cyril Loosen, hematology/oncology [facility].  He has reviewed the patient's chart.  He was able to find a note during the patient's admission following his craniotomy that indicated he did have some left facial droop.  He felt that steroids would be reasonable but deferred to neurosurgery.  I have requested transfer center consult neurosurgery. [CH]  0601 Patient stable.  No neurologic deficits noted. [CH]  [phone] Spoke with Dr. Bridgette Habermann, neurosurgery.  Described current deficits.  He feels that these are likely consistent with what he had noted postoperatively.  We also discussed current CT findings.  Given that there is no mass effect or left shift, he does not feel that any new treatment needs to be undertaken and patient can follow-up as an outpatient.  Feel this is reasonable. [CH]    Clinical Course User Index [CH] Willette Mudry, Barbette Hair, MD                           Medical Decision Making Amount and/or Complexity of Data Reviewed Labs: ordered. Radiology: ordered.   This patient presents to the ED for concern of fall, facial  droop, this involves an extensive number of treatment options, and is a complaint that carries with it a high risk of complications and morbidity.  I considered the following differential and admission for this acute, potentially life threatening condition.  The differential diagnosis includes new stroke, new mets, traumatic injury from fall  MDM:    This is a 61 year old male with known brain metastasis who presents with concern for facial droop.  He is a poor historian.  It is unclear whether the facial droop is truly new.  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  He reports that he fell because of this known weakness.  He is also complaining of left hip pain.  X-rays are negative for fracture.  Labs are largely reassuring.  CT scan obtained here.  Shows no mass effect.  Shows several findings consistent with his prior craniotomy and resection.  Unfortunately, unable to compare to postoperative images.  I spoke with hematology and oncology at [facility] and neurosurgery.  After discussion, they feel he can follow-up safely as an outpatient.  Facial droop is likely residual.  Given that there is no new mass effect or lesion on his CT scan here.  (Labs, imaging, consults)  Labs: I Ordered, and personally interpreted labs.  The pertinent results include: CBC, BMP  Imaging Studies ordered: I ordered imaging studies including CT imaging I independently visualized and interpreted imaging. I agree with the radiologist interpretation  Additional history obtained from EMS.  External records from outside source obtained and reviewed including prior hospitalization and [facility] records  Cardiac Monitoring: The patient was maintained on a cardiac monitor.  I personally viewed and interpreted the cardiac monitored which showed an underlying rhythm of: Sinus rhythm  Reevaluation: After the interventions noted above, I reevaluated the patient and found that they  have :stayed the same  Social Determinants of Health: Disabled, hemiparetic  Disposition: Discharge  Co morbidities that complicate the patient evaluation  Past Medical History:  Diagnosis Date   Arthritis    dengenerative joint surgery-  R hip & L hip   Family history of  adverse reaction to anesthesia    PAternal Margot Chimes- age 60's  "died due to too much anesthesia"   History of kidney stones    laser surgery     Medicines No orders of the defined types were placed in this encounter.   I have reviewed the patients home medicines and have made adjustments as needed  Problem List / ED Course: Problem List Items Addressed This Visit   None Visit Diagnoses     Fall, initial encounter    -  Primary   Left hemiparesis [facility])                       Final Clinical Impression(s) / ED Diagnoses Final diagnoses:  Fall, initial encounter  Left hemiparesis [facility])    Rx / DC Orders ED Discharge Orders     None         Merissa Renwick, Barbette Hair, MD 04/16/22 [phone]

## 2022-04-16 NOTE — ED Triage Notes (Addendum)
Pt arrived via [facility] from home with c/c of facial droop. Per EMS pt fell and wants to ensure his recently replaced hip is not broken. Left side weakness from tumor in the brain but as of today has new left sided facial droop.   LKN unknown.

## 2022-06-16 ENCOUNTER — Other Ambulatory Visit: Payer: Self-pay

## 2022-12-29 ENCOUNTER — Other Ambulatory Visit: Payer: Self-pay

## 2023-01-06 ENCOUNTER — Encounter (HOSPITAL_COMMUNITY): Payer: Self-pay

## 2023-01-06 ENCOUNTER — Emergency Department (HOSPITAL_COMMUNITY): Payer: No Typology Code available for payment source

## 2023-01-06 ENCOUNTER — Emergency Department (HOSPITAL_COMMUNITY)
Admission: EM | Admit: 2023-01-06 | Discharge: 2023-01-06 | Disposition: A | Discharge status: Home / Self Care | Payer: No Typology Code available for payment source | Attending: Student | Admitting: Student

## 2023-01-06 ENCOUNTER — Encounter: Payer: Self-pay | Admitting: Internal Medicine

## 2023-01-06 ENCOUNTER — Other Ambulatory Visit: Payer: Self-pay

## 2023-01-06 DIAGNOSIS — C7931 Secondary malignant neoplasm of brain: Secondary | ICD-10-CM | POA: Insufficient documentation

## 2023-01-06 DIAGNOSIS — Z7982 Long term (current) use of aspirin: Secondary | ICD-10-CM | POA: Insufficient documentation

## 2023-01-06 DIAGNOSIS — R519 Headache, unspecified: Secondary | ICD-10-CM | POA: Diagnosis not present

## 2023-01-06 DIAGNOSIS — G8194 Hemiplegia, unspecified affecting left nondominant side: Secondary | ICD-10-CM | POA: Insufficient documentation

## 2023-01-06 LAB — CBC WITH DIFFERENTIAL/PLATELET
Abs Immature Granulocytes: 0.14 10*3/uL — ABNORMAL HIGH (ref 0.00–0.07)
Basophils Absolute: 0.1 10*3/uL (ref 0.0–0.1)
Basophils Relative: 0 %
Eosinophils Absolute: 0.1 10*3/uL (ref 0.0–0.5)
Eosinophils Relative: 1 %
HCT: 43.6 % (ref 39.0–52.0)
Hemoglobin: 14.6 g/dL (ref 13.0–17.0)
Immature Granulocytes: 1 %
Lymphocytes Relative: 6 %
Lymphs Abs: 0.7 10*3/uL (ref 0.7–4.0)
MCH: 31.5 pg (ref 26.0–34.0)
MCHC: 33.5 g/dL (ref 30.0–36.0)
MCV: 94.2 fL (ref 80.0–100.0)
Monocytes Absolute: 0.7 10*3/uL (ref 0.1–1.0)
Monocytes Relative: 6 %
Neutro Abs: 10.8 10*3/uL — ABNORMAL HIGH (ref 1.7–7.7)
Neutrophils Relative %: 86 %
Platelets: 159 10*3/uL (ref 150–400)
RBC: 4.63 MIL/uL (ref 4.22–5.81)
RDW: 12.1 % (ref 11.5–15.5)
WBC: 12.5 10*3/uL — ABNORMAL HIGH (ref 4.0–10.5)
nRBC: 0 % (ref 0.0–0.2)

## 2023-01-06 LAB — BASIC METABOLIC PANEL
Anion gap: 10 (ref 5–15)
BUN: 8 mg/dL (ref 8–23)
CO2: 27 mmol/L (ref 22–32)
Calcium: 9.6 mg/dL (ref 8.9–10.3)
Chloride: 98 mmol/L (ref 98–111)
Creatinine, Ser: 0.84 mg/dL (ref 0.61–1.24)
GFR, Estimated: >60 mL/min (ref >60)
Glucose, Bld: 113 mg/dL — ABNORMAL HIGH (ref 70–99)
Potassium: 4.1 mmol/L (ref 3.5–5.1)
Sodium: 135 mmol/L (ref 135–145)

## 2023-01-06 MED ORDER — DEXAMETHASONE 4 MG PO TABS: 4.0000 mg | ORAL_TABLET | Freq: Three times a day (TID) | ORAL | 0 refills | Status: AC

## 2023-01-06 MED ORDER — SODIUM CHLORIDE 0.9 % IV BOLUS
1000.0000 mL | Freq: Once | INTRAVENOUS | Status: AC
  Administered 2023-01-06: 1000 mL via INTRAVENOUS

## 2023-01-06 MED ORDER — DEXAMETHASONE SODIUM PHOSPHATE 10 MG/ML IJ SOLN
8.0000 mg | Freq: Once | INTRAMUSCULAR | Status: AC
  Administered 2023-01-06: 8 mg via INTRAVENOUS
  Filled 2023-01-06: qty 1

## 2023-01-06 MED ORDER — GADOBUTROL 1 MMOL/ML IV SOLN
7.5000 mL | Freq: Once | INTRAVENOUS | Status: AC | PRN
  Administered 2023-01-06: 7.5 mL via INTRAVENOUS

## 2023-01-06 MED ORDER — METOCLOPRAMIDE HCL 5 MG/ML IJ SOLN
10.0000 mg | Freq: Once | INTRAMUSCULAR | Status: AC
  Administered 2023-01-06: 10 mg via INTRAVENOUS
  Filled 2023-01-06: qty 2

## 2023-01-06 MED ORDER — DIPHENHYDRAMINE HCL 50 MG/ML IJ SOLN
25.0000 mg | Freq: Once | INTRAMUSCULAR | Status: AC
  Administered 2023-01-06: 25 mg via INTRAVENOUS
  Filled 2023-01-06: qty 1

## 2023-01-06 MED ORDER — MORPHINE SULFATE (PF) 4 MG/ML IV SOLN
4.0000 mg | Freq: Once | INTRAVENOUS | Status: AC
  Administered 2023-01-06: 4 mg via INTRAVENOUS
  Filled 2023-01-06: qty 1

## 2023-01-06 NOTE — ED Provider Triage Note (Signed)
Emergency Medicine Provider Triage Evaluation Note  Nathan [name] , a 62 y.o. male  was evaluated in triage.  Pt complains of headache.  States it began approximately o'clock this morning.  It is in a headband distribution.  Rates it at a 10 out of 10.  Took aspirin at home with no relief.  States it feels similar to when he had a tumor removed approximately 3 months ago.  He has residual left-sided deficits from this.  Denies new neurodeficits.  Review of Systems  Positive: As above Negative: As above  Physical Exam  Ht 5\' 10"  (1.778 m)   Wt 72.6 kg   SpO2 98%   BMI 22.96 kg/m  Gen:   Awake, no distress   Resp:  Normal effort  MSK:   Moves extremities without difficulty  Other:  Residual left-sided hemiparesis.  RUE and RLE strength 5 out of 5.  Normal finger-to-nose on the right  Medical Decision Making  Medically screening exam initiated at 1:29 PM.  Appropriate orders placed.  Nathan [name] was informed that the remainder of the evaluation will be completed by another provider, this initial triage assessment does not replace that evaluation, and the importance of remaining in the ED until their evaluation is complete.     Nathan [name], [place] 01/06/23 1331

## 2023-01-06 NOTE — ED Triage Notes (Signed)
Pt arrives via EMS from home. Pt states he has been having a headache all day, states it woke him up out of bed around 0800 this morning. Pt reports about 3 months ago he was seen at [facility] for a similar headache and was immediately rushed to surgery to have a tumor removed. Pt does have baseline deficits to left arm, left leg and facial asymmetry from the surgery. Pt is AxOx4. Denies blood thinners, reports associated nausea.

## 2023-01-06 NOTE — ED Provider Notes (Signed)
[facility] Provider Note   CSN: [id number] Arrival date & time: 01/06/23  1320     History  Chief Complaint  Patient presents with   Headache   Nausea    Nathan [name] is a 62 y.o. 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  He states that headache began at approximately 8:00 this morning.  Feels similar to when a tumor was found approximately 3 months ago that needed removal per patient.  Headache is described as a headband distribution.  He took 2 aspirin at home with no improvement in his symptoms.  He has had some nausea with no emesis.  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  States that this has not changed.  Denies new neurological deficits including right-sided deficits, dizziness, lightheadedness, vision changes.  He is not currently on any antineoplastic.   Headache      Home Medications Prior to Admission medications   Medication Sig Start Date End Date Taking? Authorizing Provider  dexamethasone (DECADRON) 4 MG tablet Take 1 tablet (4 mg total) by mouth 3 (three) times daily for 7 days. 01/06/23 01/13/23 Yes Zannah Melucci, [place] Folk, PA-C  aspirin 81 MG chewable tablet Chew 81 mg by mouth daily.    [provider]  ibuprofen (ADVIL) 800 MG tablet Take 800 mg by mouth every 8 (eight) hours as needed for headache.    [provider]  methocarbamol (ROBAXIN) 500 MG tablet Take 1 tablet (500 mg total) by mouth 4 (four) times daily. Do not drive while taking this medication. Patient not taking: Reported on 06/08/2019 01/09/19   Harle Stanford., PA-C  omeprazole (PRILOSEC) 20 MG capsule Take 1 capsule (20 mg total) by mouth daily. Patient not taking: Reported on 06/08/2019  01/16/19   Heilingoetter, Cassandra L, PA-C  prochlorperazine (COMPAZINE) 10 MG tablet Take 1 tablet (10 mg total) by mouth every 6 (six) hours as needed for nausea or vomiting. Patient not taking: Reported on 06/08/2019 12/27/18   [name], MD  sucralfate (CARAFATE) 1 g tablet Take 1 tablet (1 g total) by mouth 4 (four) times daily -  with meals and at bedtime. Crush and dissolve in 10 cc of water prior to swallowing Patient not taking: Reported on 06/08/2019 01/11/19   Gery Pray, MD      Allergies    Patient has no known allergies.    Review of Systems   Review of Systems  Neurological:  Positive for headaches.  All other systems reviewed and are negative.   Physical Exam Updated Vital Signs BP (!) 122/95   Pulse 79   Temp 98 F (36.7 C) (Oral)   Resp (!) 21   Ht 5\' 10"  (1.778 m)   Wt 72.6 kg   SpO2 98%   BMI 22.96 kg/m  Physical Exam Vitals and nursing note reviewed.  Constitutional:      General: He is not in acute distress.    Appearance: He is well-developed.  HENT:     Head: Normocephalic and atraumatic.  Eyes:     Conjunctiva/sclera: Conjunctivae normal.  Cardiovascular:     Rate and Rhythm: Normal rate and regular rhythm.     Heart sounds: No murmur heard. Pulmonary:     Effort:  Pulmonary effort is normal. No respiratory distress.     Breath sounds: Normal breath sounds.  Abdominal:     Palpations: Abdomen is soft.     Tenderness: There is no abdominal tenderness.  Musculoskeletal:        General: No swelling.     Cervical back: Neck supple.  Skin:    General: Skin is warm and dry.     Capillary Refill: Capillary refill takes less than 2 seconds.  Neurological:     Mental Status: He is alert and oriented to person, place, and time.     GCS: GCS eye subscore is 4. GCS verbal subscore is 5. GCS motor subscore is 6.     Comments: Left-sided hemiparesis, left-sided facial asymmetry.  Right-sided upper and lower extremity strength, 5 out of 5.   Sensation intact.  Finger-to-nose and heel-to-shin on this side normal  Psychiatric:        Mood and Affect: Mood normal.        Behavior: Behavior normal.     ED Results / Procedures / Treatments   Labs (all labs ordered are listed, but only abnormal results are displayed) Labs Reviewed  BASIC METABOLIC PANEL - Abnormal; Notable for the following components:      Result Value   Glucose, Bld 113 (*)    All other components within normal limits  CBC WITH DIFFERENTIAL/PLATELET - Abnormal; Notable for the following components:   WBC 12.5 (*)    Neutro Abs 10.8 (*)    Abs Immature Granulocytes 0.14 (*)    All other components within normal limits    EKG None  Radiology MR BRAIN W WO CONTRAST  Result Date: 01/06/2023 CLINICAL DATA:  Metastatic disease evaluationaa EXAM: MRI HEAD WITHOUT AND WITH CONTRAST TECHNIQUE: Multiplanar, multiecho pulse sequences of the brain and surrounding structures were obtained without and with intravenous contrast. CONTRAST:  7.23mL GADAVIST GADOBUTROL 1 MMOL/ML IV SOLN COMPARISON:  None Available. FINDINGS: Brain: Large heterogeneously enhancing and centrally necrotic mass which is centered at the right frontal lobe and right basal ganglia with extension to involve the anterior right parietal lobe and anterior right temporal lobe. The mass is difficult to measure but measures up to 5.7 x 10.4 x 6.8 cm on series 11, image 28 and series 12, image 18. Enhancing mass extends all the way to the prior craniotomy with involvement of the dura in this region. No prior MRIs available for comparison; however, the mass has appear to enlarge paired across modalities to CT head from Apr 16, 2022. Resulting mass effect with approximately 1.1 cm of leftward midline shift at the foramen of Monro. Extensive T2/FLAIR hyperintensity surrounding the but mass which could represent edema and/or infiltrating malignancy. T2 hyperintensity extends into the right midbrain. Mild rounding of  the left temporal horn with effacement the right lateral ventricle. No evidence of acute infarct. There are areas of hemorrhage within the above mass but no mass occupying hemorrhage outside of the mass. Vascular: Major arterial flow voids are maintained at the skull base. Skull and upper cervical spine: Right-sided craniotomy. Sinuses/Orbits: Clear sinuses.  No acute orbital findings. IMPRESSION: 1. Very large necrotic and peripherally enhancing mass in the right cerebral hemisphere, described above. 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 The mass has increased in size relative to prior CT head from Apr 16, 2022. No recent MRI for comparison. 2. Extensive surrounding T2 hyperintensity and mass effect with approximately 1.1 cm of leftward midline shift at the foramen  of Monro. Also, mass effect on the midbrain. 3. Mild rounding of the left temporal horn with effacement of the right lateral ventricle, potentially early ventricular entrapment. Electronically Signed   By: Margaretha Sheffield M.D.   On: 01/06/2023 17:41   CT Head Wo Contrast  Addendum Date: 01/06/2023   ADDENDUM REPORT: 01/06/2023 15:55 ADDENDUM: Critical Value/emergent results were called by telephone at the time of interpretation on 01/06/2023 at 3:05 pm to provider [facility] , who verbally acknowledged these results. Electronically Signed   By: Emmit Alexanders M.D.   On: 01/06/2023 15:55   Result Date: 01/06/2023 CLINICAL DATA:  Headache, increasing frequency or severity. EXAM: CT HEAD WITHOUT CONTRAST TECHNIQUE: Contiguous axial images were obtained from the base of the skull through the vertex without intravenous contrast. RADIATION DOSE REDUCTION: This exam was performed according to the departmental dose-optimization program which includes automated exposure control, adjustment of the mA and/or kV according to patient size and/or use of iterative reconstruction technique. COMPARISON:  Head CT 04/16/2022.  FINDINGS: Brain: Postoperative changes of right frontotemporal craniotomy. Since the prior head CT, there are increased areas of hyperdensity with surrounding vasogenic edema in the right frontal operculum, right sub insular region and right basal ganglia with new 11 mm of leftward midline shift. More focal and intense hyperattenuation in the right subinsular region is suspicious for intratumoral hemorrhage (image 15 series 3). Entrapment of the right temporal horn with right uncal herniation (image 12 series 3). The hyperdense mass in the right frontal operculum measures a proximally 3.3 x 2.0 cm (image 12 series 3). Vascular: No hyperdense vessel or unexpected calcification. Skull: Prior right frontotemporal craniotomy. Sinuses/Orbits: Unremarkable. Other: None. IMPRESSION: 1. Findings suspicious for worsening metastatic disease with surrounding vasogenic edema in the right frontal operculum, right subinsular region and right basal ganglia with new 11 mm of leftward midline shift. More focal and intense hyperattenuation in the right subinsular region is suspicious for intratumoral hemorrhage. 2. Entrapment of the right temporal horn with right uncal herniation. Electronically Signed: By: Emmit Alexanders M.D. On: 01/06/2023 14:58    Procedures Procedures    Medications Ordered in ED Medications  metoCLOPramide (REGLAN) injection 10 mg (10 mg Intravenous Given 01/06/23 1411)  diphenhydrAMINE (BENADRYL) injection 25 mg (25 mg Intravenous Given 01/06/23 1411)  sodium chloride 0.9 % bolus 1,000 mL (1,000 mLs Intravenous New Bag/Given 01/06/23 1410)  morphine (PF) 4 MG/ML injection 4 mg (4 mg Intravenous Given 01/06/23 1412)  dexamethasone (DECADRON) injection 8 mg (8 mg Intravenous Given 01/06/23 1636)  gadobutrol (GADAVIST) 1 MMOL/ML injection 7.5 mL (7.5 mLs Intravenous Contrast Given 01/06/23 1721)    ED Course/ Medical Decision Making/ A&P Clinical Course as of 01/06/23 1801  Wed Jan 06, 2023  1425 On  reevaluation, patient's pain has been improved from a 10 out of 10 to a 2 out of 10 [AS]  1618 Spoke with neurosurgery Dr. Marcello Moores.  He recommends Decadron 4 mg every 6 hours and close follow-up with his neurosurgical and oncological team within the next week [AS]    Clinical Course User Index [AS] Awanda Wilcock, [place] Folk, PA-C                             Medical Decision Making Amount and/or Complexity of Data Reviewed Labs: ordered. Radiology: ordered.  Risk Prescription drug management.  This patient presents to the ED for concern of headache, this involves an extensive number of treatment options, and is  a complaint that carries with it a high risk of complications and morbidity.  Emergent considerations for headache include subarachnoid hemorrhage, meningitis, temporal arteritis, glaucoma, cerebral ischemia, carotid/vertebral dissection, intracranial tumor, Venous sinus thrombosis, carbon monoxide poisoning, acute or chronic subdural hemorrhage.  Other considerations include: Migraine, Cluster headache, Hypertension, Caffeine, alcohol, or drug withdrawal, Pseudotumor cerebri, Arteriovenous malformation, Head injury, Neurocysticercosis, Post-lumbar puncture, Preeclampsia, Tension headache, Sinusitis, Cervical arthritis, Refractive error causing strain, Dental abscess, Otitis media, Temporomandibular joint syndrome, Depression, Somatoform disorder (eg, somatization) Trigeminal neuralgia, Glossopharyngeal neuralgia.  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 EMS provides a portion of the history  I ordered, reviewed and interpreted labs which include: BMP, CBC.  Slight leukocytosis of 12.5.  Hyperglycemia 113   I ordered imaging studies including CT head, MRI brain I  independently visualized and interpreted imaging which showed worsening metastatic disease, 11 mm midline shift I agree with the radiologist interpretation  Cardiac Monitoring:  The patient was maintained on a cardiac monitor.  I personally viewed and interpreted the cardiac monitored which showed an underlying rhythm of: NSR  Consultations Obtained:  I requested consultation with the neurosurgery Dr. Marcello Moores,  and discussed lab and imaging findings as well as pertinent plan - they recommend: Decadron 4 mg every 8 hours, Close follow-up with his primary neurosurgery and oncology teams  Afebrile, hemodynamically stable.  62 year old male presents the ED for evaluation of a headache that began this morning.  He has a left-sided hemiparesis at baseline.  Is right upper and lower extremities are without neurologic deficits.  Patient was treated in the ED with a headache cocktail and reported complete resolution of his symptoms.  His imaging did reveal enlargement of his tumor from previous images.  He follows [facility] neurosurgery and oncology for this and we are unable to see the most recent images for comparison.  Neurosurgery was consulted and recommends as above.  He states he has an appointment with his neurosurgeon Dr. Oneita Kras in 2 days.  He was strongly encouraged to keep this appointment.  Prescription for Decadron was sent.  Return precautions were discussed.  Stable at the time of discharge.  At this time there does not appear to be any evidence of an acute emergency medical condition and the patient appears stable for discharge with appropriate outpatient follow up. Diagnosis was discussed with patient who verbalizes understanding of care plan and is agreeable to discharge. I have discussed return precautions with patient who verbalizes understanding. Patient encouraged to follow-up with their primary neurosurgeon as soon as possible. All questions answered.  Note: Portions of this report may have  been transcribed using voice recognition software. Every effort was made to ensure accuracy; however, inadvertent computerized transcription errors may still be present.         Final Clinical Impression(s) / ED Diagnoses Final diagnoses:  Metastasis to brain [facility])  Bad headache    Rx / DC Orders ED Discharge Orders          Ordered    dexamethasone (DECADRON) 4 MG tablet  3 times daily        01/06/23 1756              Nathan [name] 01/06/23 1802    Teressa Lower, MD 01/06/23 Vernelle Emerald

## 2023-01-06 NOTE — ED Notes (Signed)
Patient transported to CT 

## 2023-01-06 NOTE — Discharge Instructions (Addendum)
You have been seen today for your complaint of headache. Your imaging showed enlargement of the tumor in your brain. Your discharge medications include Decadron.  This is a steroid.  You should take it once every 8 hours as prescribed. Follow up with: Your neurosurgeon as scheduled in 2 days.  Do not miss this appointment Please seek immediate medical care if you develop any of the following symptoms: You have a seizure, particularly if this is a new symptom. You have a fever. You have new symptoms, such as vision problems or trouble walking. At this time there does not appear to be the presence of an emergent medical condition, however there is always the potential for conditions to change. Please read and follow the below instructions.  Do not take your medicine if  develop an itchy rash, swelling in your mouth or lips, or difficulty breathing; call 911 and seek immediate emergency medical attention if this occurs.  You may review your lab tests and imaging results in their entirety on your MyChart account.  Please discuss all results of fully with your primary care provider and other specialist at your follow-up visit.  Note: Portions of this text may have been transcribed using voice recognition software. Every effort was made to ensure accuracy; however, inadvertent computerized transcription errors may still be present.

## 2023-01-06 NOTE — ED Notes (Signed)
Patient transported to MRI 

## 2023-01-08 ENCOUNTER — Other Ambulatory Visit: Payer: Self-pay

## 2023-07-25 DEATH — deceased
